# Patient Record
Sex: Male | Born: 1985 | Race: Black or African American | Hispanic: No | Marital: Single | Smoking: Current every day smoker
Health system: Southern US, Community
[De-identification: ages and names within clinical notes are randomized; demographics above are authoritative.]

---

## 2004-04-20 ENCOUNTER — Emergency Department (HOSPITAL_COMMUNITY): Admission: EM | Admit: 2004-04-20 | Discharge: 2004-04-20 | Payer: Self-pay | Admitting: *Deleted

## 2004-04-25 ENCOUNTER — Emergency Department (HOSPITAL_COMMUNITY): Admission: EM | Admit: 2004-04-25 | Discharge: 2004-04-25 | Payer: Self-pay | Admitting: Family Medicine

## 2007-02-04 ENCOUNTER — Emergency Department (HOSPITAL_COMMUNITY): Admission: EM | Admit: 2007-02-04 | Discharge: 2007-02-04 | Payer: Self-pay | Admitting: Emergency Medicine

## 2007-06-11 ENCOUNTER — Emergency Department (HOSPITAL_COMMUNITY): Admission: EM | Admit: 2007-06-11 | Discharge: 2007-06-11 | Payer: Self-pay | Admitting: Emergency Medicine

## 2008-06-30 ENCOUNTER — Emergency Department (HOSPITAL_COMMUNITY): Admission: EM | Admit: 2008-06-30 | Discharge: 2008-06-30 | Payer: Self-pay | Admitting: Family Medicine

## 2008-09-14 ENCOUNTER — Emergency Department (HOSPITAL_COMMUNITY): Admission: EM | Admit: 2008-09-14 | Discharge: 2008-09-14 | Payer: Self-pay | Admitting: Emergency Medicine

## 2009-01-26 ENCOUNTER — Emergency Department (HOSPITAL_COMMUNITY): Admission: EM | Admit: 2009-01-26 | Discharge: 2009-01-26 | Payer: Self-pay | Admitting: Family Medicine

## 2009-07-12 ENCOUNTER — Emergency Department (HOSPITAL_COMMUNITY): Admission: EM | Admit: 2009-07-12 | Discharge: 2009-07-12 | Payer: Self-pay | Admitting: Family Medicine

## 2009-10-20 ENCOUNTER — Emergency Department (HOSPITAL_COMMUNITY): Admission: EM | Admit: 2009-10-20 | Discharge: 2009-10-20 | Payer: Self-pay | Admitting: Emergency Medicine

## 2010-02-22 ENCOUNTER — Emergency Department (HOSPITAL_COMMUNITY)
Admission: EM | Admit: 2010-02-22 | Discharge: 2010-02-23 | Payer: Self-pay | Source: Home / Self Care | Admitting: Emergency Medicine

## 2010-05-26 LAB — GC/CHLAMYDIA PROBE AMP, GENITAL
Chlamydia, DNA Probe: POSITIVE — AB
GC Probe Amp, Genital: NEGATIVE

## 2010-05-30 LAB — GC/CHLAMYDIA PROBE AMP, GENITAL: GC Probe Amp, Genital: POSITIVE — AB

## 2010-06-14 ENCOUNTER — Inpatient Hospital Stay (INDEPENDENT_AMBULATORY_CARE_PROVIDER_SITE_OTHER)
Admission: RE | Admit: 2010-06-14 | Discharge: 2010-06-14 | Disposition: A | Discharge status: Home / Self Care | Payer: Self-pay | Source: Ambulatory Visit | Attending: Emergency Medicine | Admitting: Emergency Medicine

## 2010-06-14 DIAGNOSIS — N342 Other urethritis: Secondary | ICD-10-CM

## 2010-06-15 LAB — GC/CHLAMYDIA PROBE AMP, GENITAL: Chlamydia, DNA Probe: NEGATIVE

## 2010-07-19 ENCOUNTER — Emergency Department (HOSPITAL_COMMUNITY)
Admission: EM | Admit: 2010-07-19 | Discharge: 2010-07-19 | Disposition: A | Discharge status: Home / Self Care | Payer: Self-pay | Attending: Emergency Medicine | Admitting: Emergency Medicine

## 2010-07-19 DIAGNOSIS — H0019 Chalazion unspecified eye, unspecified eyelid: Secondary | ICD-10-CM | POA: Insufficient documentation

## 2010-07-19 DIAGNOSIS — H5789 Other specified disorders of eye and adnexa: Secondary | ICD-10-CM | POA: Insufficient documentation

## 2010-12-05 LAB — GC/CHLAMYDIA PROBE AMP, GENITAL
Chlamydia, DNA Probe: NEGATIVE
GC Probe Amp, Genital: NEGATIVE

## 2010-12-19 LAB — GC/CHLAMYDIA PROBE AMP, GENITAL
Chlamydia, DNA Probe: NEGATIVE
GC Probe Amp, Genital: POSITIVE — AB

## 2010-12-19 LAB — RPR: RPR Ser Ql: NONREACTIVE

## 2012-11-15 ENCOUNTER — Emergency Department (HOSPITAL_COMMUNITY)
Admission: EM | Admit: 2012-11-15 | Discharge: 2012-11-15 | Disposition: A | Discharge status: Home / Self Care | Payer: Self-pay | Attending: Emergency Medicine | Admitting: Emergency Medicine

## 2012-11-15 ENCOUNTER — Encounter (HOSPITAL_COMMUNITY): Payer: Self-pay | Admitting: Emergency Medicine

## 2012-11-15 DIAGNOSIS — R21 Rash and other nonspecific skin eruption: Secondary | ICD-10-CM | POA: Insufficient documentation

## 2012-11-15 DIAGNOSIS — F411 Generalized anxiety disorder: Secondary | ICD-10-CM | POA: Insufficient documentation

## 2012-11-15 DIAGNOSIS — T7840XA Allergy, unspecified, initial encounter: Secondary | ICD-10-CM

## 2012-11-15 DIAGNOSIS — F17200 Nicotine dependence, unspecified, uncomplicated: Secondary | ICD-10-CM | POA: Insufficient documentation

## 2012-11-15 DIAGNOSIS — T4995XA Adverse effect of unspecified topical agent, initial encounter: Secondary | ICD-10-CM | POA: Insufficient documentation

## 2012-11-15 DIAGNOSIS — Z880 Allergy status to penicillin: Secondary | ICD-10-CM | POA: Insufficient documentation

## 2012-11-15 MED ORDER — PREDNISONE 20 MG PO TABS: ORAL_TABLET | ORAL | Status: DC

## 2012-11-15 MED ORDER — HYDROXYZINE HCL 25 MG PO TABS
25.0000 mg | ORAL_TABLET | Freq: Once | ORAL | Status: AC
  Administered 2012-11-15: 25 mg via ORAL
  Filled 2012-11-15: qty 1

## 2012-11-15 MED ORDER — FAMOTIDINE 20 MG PO TABS: 20.0000 mg | ORAL_TABLET | Freq: Two times a day (BID) | ORAL | Status: DC

## 2012-11-15 MED ORDER — PREDNISONE 20 MG PO TABS
60.0000 mg | ORAL_TABLET | Freq: Once | ORAL | Status: AC
  Administered 2012-11-15: 60 mg via ORAL
  Filled 2012-11-15: qty 3

## 2012-11-15 MED ORDER — DIPHENHYDRAMINE HCL 25 MG PO TABS: 25.0000 mg | ORAL_TABLET | Freq: Four times a day (QID) | ORAL | Status: DC

## 2012-11-15 NOTE — ED Provider Notes (Signed)
Medical screening examination/treatment/procedure(s) were performed by non-physician practitioner and as supervising physician I was immediately available for consultation/collaboration.    Gilda Crease, MD 11/15/12 1410

## 2012-11-15 NOTE — ED Notes (Signed)
Pt c/o rash on arm and legs x3 days.  Reports rash appeared after a he got a tattoo the same day.  Reports hearing a recall on ink.

## 2012-11-15 NOTE — ED Provider Notes (Signed)
CSN: [id number]     Arrival date & time 11/15/12  1301 History   First MD Initiated Contact with Patient 11/15/12 1321     Chief Complaint  Patient presents with  . Rash   (Consider location/radiation/quality/duration/timing/severity/associated sxs/prior Treatment) HPI Comments: Patient presents with full body pruritic rash x3 days. States that the rash began after getting a tattoo. States that he is very worried because he read that there is a recall on tattoo ink and he is worried that he has a bacterial infection from the ink. Patient denies fever, chills, body aches, nausea, vomiting, abdominal pain.  Denies any pain with a rash, denies any discharge from the rash. States this was a new tattoo parlor where he received the tattoo. He does not know what kind of ink they used.  Denies difficulty swallowing or breathing.   Patient is a 27 y.o. male presenting with rash. The history is provided by the patient.  Rash Associated symptoms: no abdominal pain, no diarrhea, no fever, no nausea, no shortness of breath, no sore throat and not vomiting     History reviewed. No pertinent past medical history. History reviewed. No pertinent past surgical history. History reviewed. No pertinent family history. History  Substance Use Topics  . Smoking status: Current Every Day Smoker  . Smokeless tobacco: Not on file  . Alcohol Use: Yes    Review of Systems  Constitutional: Negative for fever.  HENT: Negative for sore throat and trouble swallowing.   Respiratory: Negative for cough and shortness of breath.   Cardiovascular: Negative for chest pain.  Gastrointestinal: Negative for nausea, vomiting, abdominal pain and diarrhea.  Skin: Positive for rash. Negative for wound.    Allergies  Penicillins  Home Medications   Current Outpatient Rx  Name  Route  Sig  Dispense  Refill  . diphenhydrAMINE (BENADRYL) 25 mg capsule   Oral   Take 25 mg by mouth every 6 (six) hours as needed for itching.         . diphenhydrAMINE (BENADRYL) 25 MG tablet   Oral   Take 1 tablet (25 mg total) by mouth every 6 (six) hours. X 3 days, then as needed   20 tablet   0   . famotidine (PEPCID) 20 MG tablet   Oral   Take 1 tablet (20 mg total) by mouth 2 (two) times daily. X 3 days then PRN   15 tablet   0   . predniSONE (DELTASONE) 20 MG tablet      3 tabs po daily x 3 days, then 2 tabs x 3 days, then 1.5 tabs x 3 days, then 1 tab x 3 days, then 0.5 tabs x 3 days   27 tablet   0    BP 123/78  Pulse 94  Temp(Src) 98.6 F (37 C) (Oral)  Resp 14  SpO2 100% Physical Exam  Nursing note and vitals reviewed. Constitutional: He appears well-developed and well-nourished. No distress.  HENT:  Head: Normocephalic and atraumatic.  Neck: Neck supple.  Pulmonary/Chest: Effort normal.  Neurological: He is alert.  Skin: Rash noted. He is not diaphoretic.  Erythematous papular rash throughout the entire body. Tattoos over her upper chest and upper back are without erythema, edema, warmth, discharge. They are nontender.  Psychiatric: His mood appears anxious.    ED Course  Procedures (including critical care time) Labs Review Labs Reviewed - No data to display Imaging Review No results found.  MDM   1. Allergic reaction, initial encounter  Patient is afebrile, nontoxic, normal vital sign. He has a full body erythematous rash that is pruritic and nontender. Appears to be allergic reaction. Patient does not have any symptoms with this mouth or throat and no difficulty breathing or swallowing. The skin around his tattoo site is not cellulitic, no evidence of abscess. I doubt bacterial infection. The ink recalled was mostly used in tattooing and is unlikely to have been the ink used by the tattoo parlor. However, I have asked patient to followup with the tattoo parlor for his own anxiety. Reassured the patient that this is not a bacterial infection and he does not have sepsis.  Discussed   findings, treatment, and follow up  with patient.  Pt given return precautions.  Pt verbalizes understanding and agrees with plan.        Trixie Dredge, PA-C 11/15/12 1409

## 2013-05-12 ENCOUNTER — Encounter (HOSPITAL_COMMUNITY): Payer: Self-pay | Admitting: Emergency Medicine

## 2013-05-12 ENCOUNTER — Emergency Department (HOSPITAL_COMMUNITY): Payer: Self-pay

## 2013-05-12 ENCOUNTER — Emergency Department (HOSPITAL_COMMUNITY)
Admission: EM | Admit: 2013-05-12 | Discharge: 2013-05-12 | Disposition: A | Discharge status: Home / Self Care | Payer: Self-pay | Attending: Emergency Medicine | Admitting: Emergency Medicine

## 2013-05-12 DIAGNOSIS — Y929 Unspecified place or not applicable: Secondary | ICD-10-CM | POA: Insufficient documentation

## 2013-05-12 DIAGNOSIS — R109 Unspecified abdominal pain: Secondary | ICD-10-CM | POA: Insufficient documentation

## 2013-05-12 DIAGNOSIS — T148XXA Other injury of unspecified body region, initial encounter: Secondary | ICD-10-CM

## 2013-05-12 DIAGNOSIS — M545 Low back pain, unspecified: Secondary | ICD-10-CM

## 2013-05-12 DIAGNOSIS — F17200 Nicotine dependence, unspecified, uncomplicated: Secondary | ICD-10-CM | POA: Insufficient documentation

## 2013-05-12 DIAGNOSIS — S335XXA Sprain of ligaments of lumbar spine, initial encounter: Secondary | ICD-10-CM | POA: Insufficient documentation

## 2013-05-12 DIAGNOSIS — X500XXA Overexertion from strenuous movement or load, initial encounter: Secondary | ICD-10-CM | POA: Insufficient documentation

## 2013-05-12 DIAGNOSIS — Y9389 Activity, other specified: Secondary | ICD-10-CM | POA: Insufficient documentation

## 2013-05-12 DIAGNOSIS — Z880 Allergy status to penicillin: Secondary | ICD-10-CM | POA: Insufficient documentation

## 2013-05-12 LAB — COMPREHENSIVE METABOLIC PANEL
ALBUMIN: 4 g/dL (ref 3.5–5.2)
ALK PHOS: 41 U/L (ref 39–117)
ALT: 15 U/L (ref 0–53)
AST: 19 U/L (ref 0–37)
BUN: 10 mg/dL (ref 6–23)
CALCIUM: 9.4 mg/dL (ref 8.4–10.5)
CO2: 27 meq/L (ref 19–32)
CREATININE: 0.97 mg/dL (ref 0.50–1.35)
Chloride: 99 mEq/L (ref 96–112)
GFR calc Af Amer: >90 mL/min (ref >90)
GFR calc non Af Amer: >90 mL/min (ref >90)
GLUCOSE: 85 mg/dL (ref 70–99)
Potassium: 3.4 mEq/L — ABNORMAL LOW (ref 3.7–5.3)
SODIUM: 138 meq/L (ref 137–147)
Total Bilirubin: 0.4 mg/dL (ref 0.3–1.2)
Total Protein: 7.7 g/dL (ref 6.0–8.3)

## 2013-05-12 LAB — CBC WITH DIFFERENTIAL/PLATELET
BASOS ABS: 0.1 10*3/uL (ref 0.0–0.1)
Basophils Relative: 1 % (ref 0–1)
Eosinophils Absolute: 0.2 10*3/uL (ref 0.0–0.7)
Eosinophils Relative: 3 % (ref 0–5)
HEMATOCRIT: 43.8 % (ref 39.0–52.0)
Hemoglobin: 15.3 g/dL (ref 13.0–17.0)
LYMPHS PCT: 35 % (ref 12–46)
Lymphs Abs: 2.4 10*3/uL (ref 0.7–4.0)
MCH: 32.4 pg (ref 26.0–34.0)
MCHC: 34.9 g/dL (ref 30.0–36.0)
MCV: 92.8 fL (ref 78.0–100.0)
Monocytes Absolute: 0.8 10*3/uL (ref 0.1–1.0)
Monocytes Relative: 12 % (ref 3–12)
NEUTROS ABS: 3.3 10*3/uL (ref 1.7–7.7)
NEUTROS PCT: 49 % (ref 43–77)
PLATELETS: 291 10*3/uL (ref 150–400)
RBC: 4.72 MIL/uL (ref 4.22–5.81)
RDW: 12.2 % (ref 11.5–15.5)
WBC: 6.8 10*3/uL (ref 4.0–10.5)

## 2013-05-12 LAB — URINALYSIS, ROUTINE W REFLEX MICROSCOPIC
Bilirubin Urine: NEGATIVE
GLUCOSE, UA: NEGATIVE mg/dL
Hgb urine dipstick: NEGATIVE
Ketones, ur: NEGATIVE mg/dL
Leukocytes, UA: NEGATIVE
NITRITE: NEGATIVE
PH: 6 (ref 5.0–8.0)
Protein, ur: NEGATIVE mg/dL
Specific Gravity, Urine: 1.023 (ref 1.005–1.030)
Urobilinogen, UA: 0.2 mg/dL (ref 0.0–1.0)

## 2013-05-12 LAB — LIPASE, BLOOD: Lipase: 32 U/L (ref 11–59)

## 2013-05-12 MED ORDER — HYDROCODONE-ACETAMINOPHEN 5-325 MG PO TABS: ORAL_TABLET | ORAL | Status: DC

## 2013-05-12 MED ORDER — KETOROLAC TROMETHAMINE 60 MG/2ML IM SOLN
60.0000 mg | Freq: Once | INTRAMUSCULAR | Status: AC
  Administered 2013-05-12: 60 mg via INTRAMUSCULAR
  Filled 2013-05-12: qty 2

## 2013-05-12 NOTE — ED Notes (Signed)
Pt reports that he has been having R sided flank pain with a strong odor to his urine, reports he has been taking Motrin at night so that he is able to sleep and that he took x1 Amoxicillin from his cousin. Pt states that he lifted a heavy object last Tuesday, but the pain has been gradually increasing since that time. Pt a&o x4, ambulatory to triage.

## 2013-05-12 NOTE — Progress Notes (Signed)
   CARE MANAGEMENT ED NOTE 05/12/2013  Patient:  Alben SpittleHERBIN,Vong L   Account Number:  [id number]  Date Initiated:  05/12/2013  Documentation initiated by:  Radford PaxFERRERO,Lafaye Mcelmurry  Subjective/Objective Assessment:   Patient prsents to Ed with right sided flank pain     Subjective/Objective Assessment Detail:     Action/Plan:   Action/Plan Detail:   Anticipated DC Date:  05/12/2013     Status Recommendation to Physician:   Result of Recommendation:    Other ED Services  Consult Working Plan    DC Planning Services  Other  PCP issues    Choice offered to / List presented to:            Status of service:  Completed, signed off  ED Comments:   ED Comments Detail:  Patient confirms he does not have a pcp.  EDCM providedpatient with a list of pcps who accept patients without insurance, list of discounted pharmacies and website needymeds.org for [employer] assistance, financial resources in the community such as  local churches and [employer], information regarding Affordable care act and medicaid for insurance, phone number to inquire about the [place] card, and dental assistance for uninsured patients.  Patient reports he does not need any help with medications at this time.  Patient thankful for resources. No further EDCM needs at this time.

## 2013-05-12 NOTE — ED Notes (Signed)
Patient transported to X-ray 

## 2013-05-12 NOTE — ED Provider Notes (Signed)
CSN: [id number]     Arrival date & time 05/12/13  1919 History   First MD Initiated Contact with Patient 05/12/13 1956     Chief Complaint  Patient presents with  . Flank Pain     (Consider location/radiation/quality/duration/timing/severity/associated sxs/prior Treatment) HPI Pt is a 28yo male with no significant PMH c/o right sided flank pain and right lower back pain that has been intermittent x 1 week, reports lifting a heavy trash can last Tuesday, 2/24, pain has been gradually worsening since that time. Does report strong odor to his urine but denies fever, n/v/d. Denies abdominal pain. Denies difficult urinating, dysuria, or hematuria. Denies hx of renal stones. Has been taking Motrin PM to help him sleep that provides moderate relief and one dose of Amoxicillin from his cousin but states no improvement with that. Denies hx of abdominal surgeries or hernias.   History reviewed. No pertinent past medical history. History reviewed. No pertinent past surgical history. History reviewed. No pertinent family history. History  Substance Use Topics  . Smoking status: Current Every Day Smoker -- 0.50 packs/day    Types: Cigarettes  . Smokeless tobacco: Never Used  . Alcohol Use: Yes    Review of Systems  Constitutional: Negative for fever and chills.  Gastrointestinal: Negative for nausea, vomiting, abdominal pain, diarrhea and constipation.  Genitourinary: Positive for flank pain ( right). Negative for dysuria.  All other systems reviewed and are negative.      Allergies  Penicillins  Home Medications   Current Outpatient Rx  Name  Route  Sig  Dispense  Refill  . ibuprofen (ADVIL,MOTRIN) 200 MG tablet   Oral   Take 200 mg by mouth every 6 (six) hours as needed (pain.).         Marland Kitchen HYDROcodone-acetaminophen (NORCO/VICODIN) 5-325 MG per tablet      Take 1-2 pills every 4-6 hours as needed for pain.   10 tablet   0    BP 139/84  Pulse 77  Temp(Src) 98.1 F (36.7 C)  (Oral)  Resp 16  SpO2 100% Physical Exam  Nursing note and vitals reviewed. Constitutional: He appears well-developed and well-nourished.  Pt lying comfortably in exam bed, NAD.   HENT:  Head: Normocephalic and atraumatic.  Eyes: Conjunctivae are normal. No scleral icterus.  Neck: Normal range of motion. Neck supple.  Cardiovascular: Normal rate, regular rhythm and normal heart sounds.   Pulmonary/Chest: Effort normal and breath sounds normal. No respiratory distress. He has no wheezes. He has no rales. He exhibits no tenderness.  Abdominal: Soft. Bowel sounds are normal. He exhibits no distension and no mass. There is no tenderness. There is no rebound and no guarding.  Soft, non-distended, non-tender. No CVAT  Musculoskeletal: Normal range of motion. He exhibits tenderness. He exhibits no edema.  Tenderness along lower lumbar spine and right lumbar musculature. No step offs or crepitus. Normal gait. FROM all 4 extremities w/o difficulty.   Neurological: He is alert.  Skin: Skin is warm and dry.    ED Course  Procedures (including critical care time) Labs Review Labs Reviewed  COMPREHENSIVE METABOLIC PANEL - Abnormal; Notable for the following:    Potassium 3.4 (*)    All other components within normal limits  URINALYSIS, ROUTINE W REFLEX MICROSCOPIC  CBC WITH DIFFERENTIAL  LIPASE, BLOOD   Imaging Review Dg Lumbar Spine Complete  05/12/2013   CLINICAL DATA:  Back pain.  EXAM: LUMBAR SPINE - COMPLETE 4+ VIEW  COMPARISON:  None.  FINDINGS: Normal  alignment of the lumbar vertebral bodies. Disc spaces and vertebral bodies are maintained. The facets are normally aligned. No pars defects. The visualized bony pelvis is intact.  IMPRESSION: Normal alignment and no acute bony findings.   Electronically Signed   By: Loralie ChampagneMark  Gallerani M.D.   On: 05/12/2013 20:49     EKG Interpretation None      MDM   Final diagnoses:  Right low back pain  Right flank pain  Muscle strain    Pt c/o  right sided lower back pain and flank pain x1 week, worsening after lifting heavy trash can 1 week ago.  Pt reports strong odor to urine but denies dysuria or hematuria. No hx of renal stones. Vitals: WNL. Pt appears well. Pt is tender along lumbar spine and musculature. Low concern for renal stone.  Discussed getting plain films lumbar spine due to pt's tenderness and reported possible MOI with lifting trash can.  Pt smiled at suggestion of imaging, when asked if he wanted any further workup, pt agreed.    Plain films: normal alignment, no acute bony findings.  Will tx for muscle strain.  CT abd not ordered at this time as pt will be tx symptomatically for pain. Not concerned for obstruction, surgical abdomen or emergent process taking place at this. Advised to f/u with PCP in 2-3 days if symptoms not improving.  Return precautions provided. Pt verbalized understanding and agreement with tx plan.    Junius FinnerErin O'Malley, PA-C 05/13/13 0102

## 2013-05-20 NOTE — ED Provider Notes (Signed)
Medical screening examination/treatment/procedure(s) were performed by non-physician practitioner and as supervising physician I was immediately available for consultation/collaboration.   EKG Interpretation None        Izabellah Dadisman, MD 05/20/13 0725 

## 2014-10-13 ENCOUNTER — Emergency Department (HOSPITAL_COMMUNITY)
Admission: EM | Admit: 2014-10-13 | Discharge: 2014-10-13 | Disposition: A | Discharge status: Home / Self Care | Payer: Self-pay | Attending: Emergency Medicine | Admitting: Emergency Medicine

## 2014-10-13 ENCOUNTER — Encounter (HOSPITAL_COMMUNITY): Payer: Self-pay | Admitting: Emergency Medicine

## 2014-10-13 DIAGNOSIS — Z202 Contact with and (suspected) exposure to infections with a predominantly sexual mode of transmission: Secondary | ICD-10-CM | POA: Insufficient documentation

## 2014-10-13 DIAGNOSIS — Z720 Tobacco use: Secondary | ICD-10-CM | POA: Insufficient documentation

## 2014-10-13 DIAGNOSIS — Z880 Allergy status to penicillin: Secondary | ICD-10-CM | POA: Insufficient documentation

## 2014-10-13 MED ORDER — AZITHROMYCIN 250 MG PO TABS
1000.0000 mg | ORAL_TABLET | Freq: Once | ORAL | Status: AC
  Administered 2014-10-13: 1000 mg via ORAL
  Filled 2014-10-13: qty 4

## 2014-10-13 MED ORDER — LIDOCAINE HCL 1 % IJ SOLN
INTRAMUSCULAR | Status: AC
  Administered 2014-10-13: 20 mL
  Filled 2014-10-13: qty 20

## 2014-10-13 MED ORDER — CEFTRIAXONE SODIUM 250 MG IJ SOLR
250.0000 mg | Freq: Once | INTRAMUSCULAR | Status: AC
  Administered 2014-10-13: 250 mg via INTRAMUSCULAR
  Filled 2014-10-13: qty 250

## 2014-10-13 NOTE — ED Provider Notes (Signed)
CSN: [id number]     Arrival date & time 10/13/14  1459 History  This chart was scribed for non-physician practitioner Santiago Glad, PA-C working with Rolland Porter, MD by Littie Deeds, ED Scribe. This patient was seen in room WTR5/WTR5 and the patient's care was started at 3:33 PM.       Chief Complaint  Patient presents with  . Exposure to STD   The history is provided by the patient. No language interpreter was used.   HPI Comments: Andrew Preston is a 29 y.o. male who presents to the Emergency Department for a STD check. Patient was called by a sexual partner this morning and was told to be tested for STD's, although he was not told which STD. He did have unprotected sexual intercourse with this partner. Patient denies fever, penile discharge, dysuria, testicular pain, scrotal swelling, penile pain, and penile swelling. He does have history of gonorrhea.   History reviewed. No pertinent past medical history. History reviewed. No pertinent past surgical history. History reviewed. No pertinent family history. History  Substance Use Topics  . Smoking status: Current Every Day Smoker -- 0.50 packs/day    Types: Cigarettes  . Smokeless tobacco: Never Used  . Alcohol Use: Yes    Review of Systems  Constitutional: Negative for fever.  Genitourinary: Negative for dysuria, discharge, penile swelling, scrotal swelling, penile pain and testicular pain.      Allergies  Penicillins  Home Medications   Prior to Admission medications   Medication Sig Start Date End Date Taking? Authorizing Provider  HYDROcodone-acetaminophen (NORCO/VICODIN) 5-325 MG per tablet Take 1-2 pills every 4-6 hours as needed for pain. 05/12/13   Junius Finner, PA-C  ibuprofen (ADVIL,MOTRIN) 200 MG tablet Take 200 mg by mouth every 6 (six) hours as needed (pain.).    Historical Provider, MD   BP 123/71 mmHg  Pulse 74  Temp(Src) 98.5 F (36.9 C) (Oral)  Resp 17  SpO2 100% Physical Exam  Constitutional: He is  oriented to person, place, and time. He appears well-developed and well-nourished. No distress.  HENT:  Head: Normocephalic and atraumatic.  Mouth/Throat: Oropharynx is clear and moist. No oropharyngeal exudate.  Eyes: Pupils are equal, round, and reactive to light.  Neck: Neck supple.  Cardiovascular: Normal rate, regular rhythm and normal heart sounds.   Pulmonary/Chest: Effort normal and breath sounds normal.  Genitourinary:  Patient declined  Musculoskeletal: He exhibits no edema.  Neurological: He is alert and oriented to person, place, and time. No cranial nerve deficit.  Skin: Skin is warm and dry. No rash noted.  Psychiatric: He has a normal mood and affect. His behavior is normal.  Nursing note and vitals reviewed.   ED Course  Procedures  DIAGNOSTIC STUDIES: Oxygen Saturation is 100% on room air, normal by my interpretation.    COORDINATION OF CARE: 3:37 PM-Discussed treatment plan which includes medications and labs with patient/guardian at bedside and patient/guardian agreed to plan.   Labs Review Labs Reviewed - No data to display  Imaging Review No results found.   EKG Interpretation None      MDM   Final diagnoses:  None   Patient presents today requesting STD check.  He states that a partner called him this morning and told him that he needs to get tested due to the fact that she was diagnosed with a STD.  He is unsure of what STD she had.  Patient is asymptomatic at this time.  GC/Chlamydia and HIV pending.  Patient given  prophylactic treatment with Azithromycin and Rocephin.  Stable for discharge.  Return precautions given.     Santiago Glad, PA-C 10/13/14 1641  Rolland Porter, MD 10/19/14 [phone]

## 2014-10-13 NOTE — ED Notes (Signed)
Reports a previous partner called him this morning and told him she had an STD-unsure which kind. Denies fever/penile discharge/etc. No other c/c.

## 2014-10-14 LAB — GC/CHLAMYDIA PROBE AMP (~~LOC~~) NOT AT ARMC
CHLAMYDIA, DNA PROBE: POSITIVE — AB
NEISSERIA GONORRHEA: NEGATIVE

## 2014-10-14 LAB — HIV ANTIBODY (ROUTINE TESTING W REFLEX): HIV SCREEN 4TH GENERATION: NONREACTIVE

## 2014-10-15 ENCOUNTER — Telehealth (HOSPITAL_COMMUNITY): Payer: Self-pay | Admitting: Emergency Medicine

## 2014-10-15 NOTE — Telephone Encounter (Signed)
Positive Chlamydia culture Treated with Zithromax and Rocephin per protocol MD [facility] faxed  Patient contacted 10/15/14 @ 1200 pt returned call, ID verified x three. Notified of positive Chlamydia and that treated with Zithromax and Rocephin. STD instructions given, patient verbalized understanding.

## 2014-10-21 IMAGING — CR DG LUMBAR SPINE COMPLETE 4+V
5 series · 5 of 5 positions shown · non-contrast
Comparison: None.

CLINICAL DATA: Back pain.

EXAM:
LUMBAR SPINE - COMPLETE 4+ VIEW

[t lumbar spine ap]
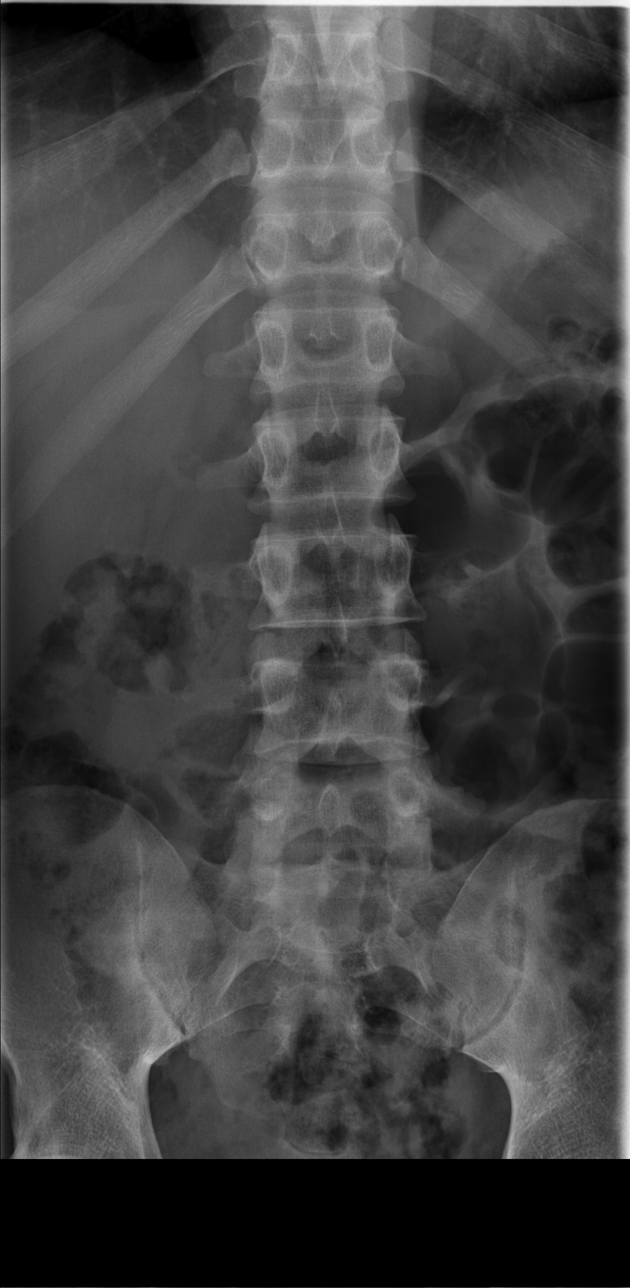

[t lumbar spine obl (1 of 2)]
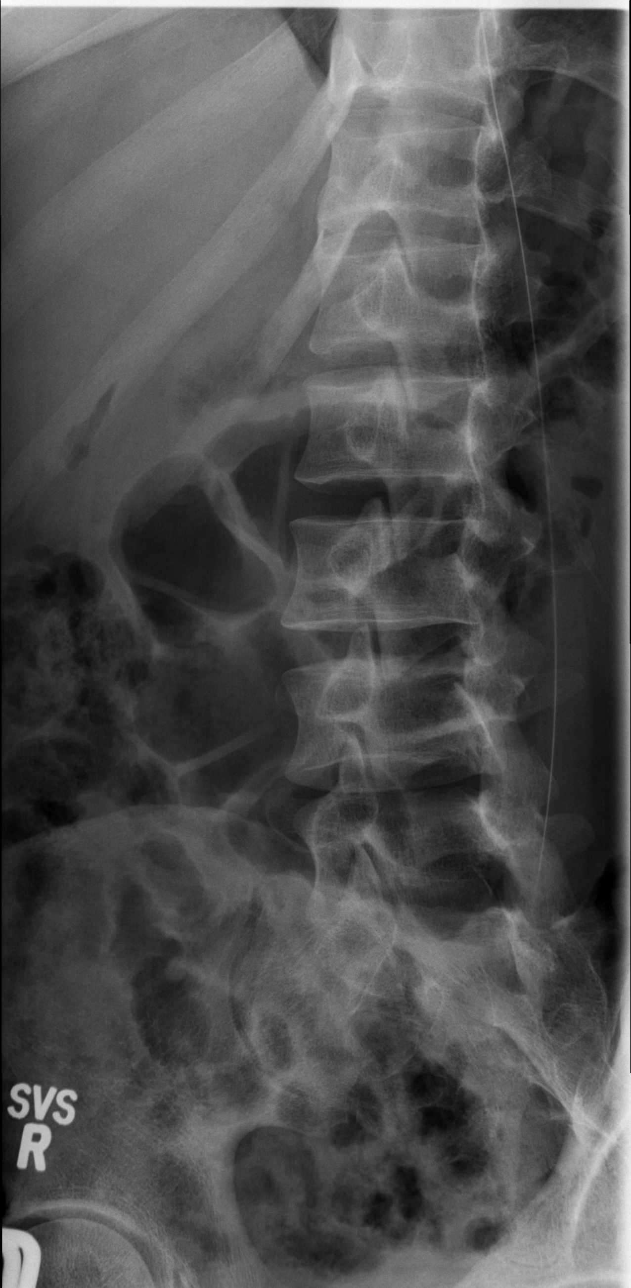

[t lumbar spine obl (2 of 2)]
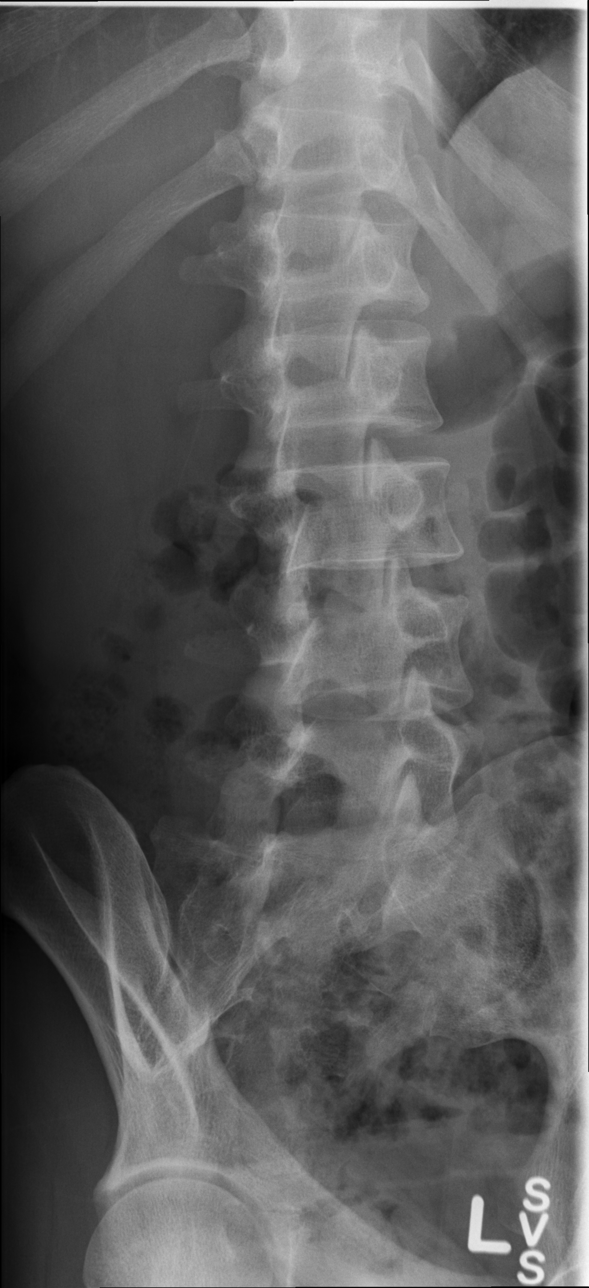

[t lumbar spine lat]
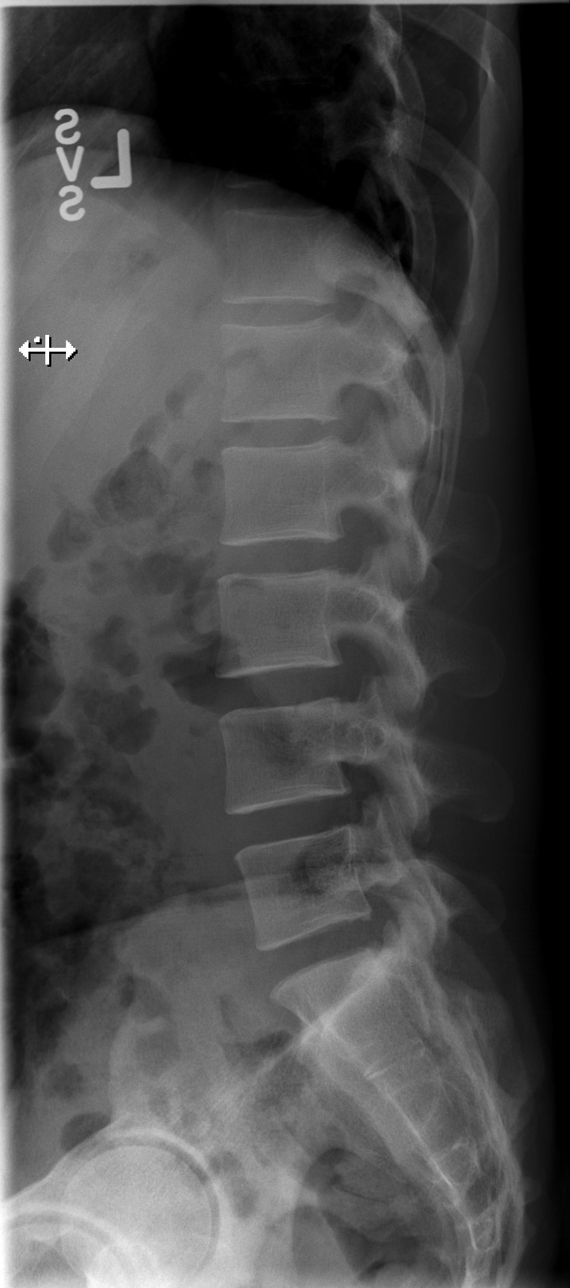

[t lumbar l-5 s-1 spot]
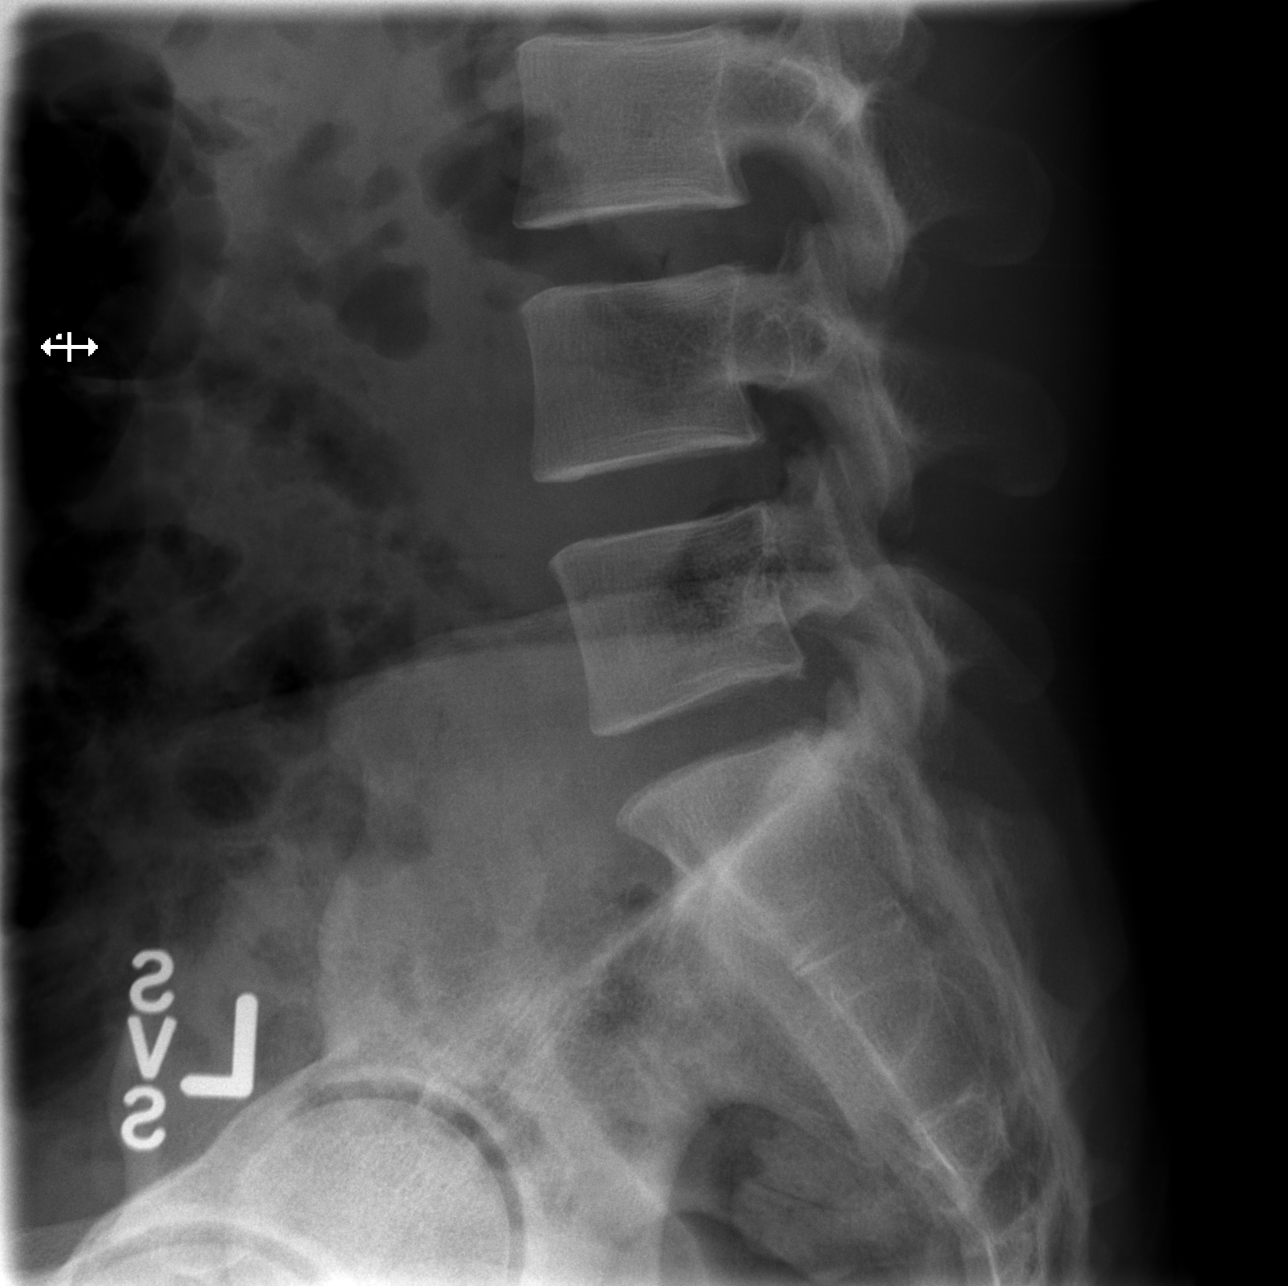

[5 of 5 positions shown; findings below may reference images not displayed]

FINDINGS: Normal alignment of the lumbar vertebral bodies. Disc spaces and
vertebral bodies are maintained. The facets are normally aligned. No
pars defects. The visualized bony pelvis is intact.
IMPRESSION: Normal alignment and no acute bony findings.

## 2014-11-10 ENCOUNTER — Emergency Department (HOSPITAL_COMMUNITY)
Admission: EM | Admit: 2014-11-10 | Discharge: 2014-11-10 | Disposition: A | Discharge status: Home / Self Care | Payer: Self-pay | Attending: Emergency Medicine | Admitting: Emergency Medicine

## 2014-11-10 ENCOUNTER — Encounter (HOSPITAL_COMMUNITY): Payer: Self-pay | Admitting: Emergency Medicine

## 2014-11-10 DIAGNOSIS — Z720 Tobacco use: Secondary | ICD-10-CM | POA: Insufficient documentation

## 2014-11-10 DIAGNOSIS — Z880 Allergy status to penicillin: Secondary | ICD-10-CM | POA: Insufficient documentation

## 2014-11-10 DIAGNOSIS — Z202 Contact with and (suspected) exposure to infections with a predominantly sexual mode of transmission: Secondary | ICD-10-CM | POA: Insufficient documentation

## 2014-11-10 MED ORDER — AZITHROMYCIN 250 MG PO TABS
1000.0000 mg | ORAL_TABLET | Freq: Once | ORAL | Status: AC
  Administered 2014-11-10: 1000 mg via ORAL
  Filled 2014-11-10: qty 4

## 2014-11-10 MED ORDER — LIDOCAINE HCL (PF) 1 % IJ SOLN
0.9000 mL | Freq: Once | INTRAMUSCULAR | Status: AC
  Administered 2014-11-10: 0.9 mL
  Filled 2014-11-10: qty 5

## 2014-11-10 MED ORDER — CEFTRIAXONE SODIUM 250 MG IJ SOLR
250.0000 mg | Freq: Once | INTRAMUSCULAR | Status: AC
  Administered 2014-11-10: 250 mg via INTRAMUSCULAR
  Filled 2014-11-10: qty 250

## 2014-11-10 NOTE — ED Notes (Signed)
Per patient, states he was diagnosed with STD, states he was treated but still having symptoms

## 2014-11-10 NOTE — ED Provider Notes (Signed)
CSN: [id number]     Arrival date & time 11/10/14  1509 History  This chart was scribed for non-physician practitioner Joycie Peek, PA-C working with Mirian Mo, MD by Murriel Hopper, ED Scribe. This patient was seen in room WTR5/WTR5 and the patient's care was started at 4:15 PM.    Chief Complaint  Patient presents with  . Penile Discharge      The history is provided by the patient. No language interpreter was used.     HPI Comments: Andrew Preston is a 29 y.o. male who presents to the Emergency Department complaining of constant recurrent penile discharge that has been present for a week. Pt states he was seen on 10/13/14 and treated for chlamydia, and states that his symptoms went away until around a week and a half ago when he had intercourse for the first time since being treated. Pt states he began having discharge again a week ago and notes that he has had discharge constantly since then. Pt denies abdominal pain, fever, vomiting, dysuria, hematuria. Pt refuses to receive GU exam.    History reviewed. No pertinent past medical history. History reviewed. No pertinent past surgical history. No family history on file. Social History  Substance Use Topics  . Smoking status: Current Every Day Smoker -- 0.50 packs/day    Types: Cigarettes  . Smokeless tobacco: Never Used  . Alcohol Use: Yes    Review of Systems  Constitutional: Negative for fever.  Gastrointestinal: Negative for vomiting and abdominal pain.  Genitourinary: Positive for discharge. Negative for dysuria and hematuria.  Musculoskeletal: Negative for arthralgias.  Skin: Negative for rash.      Allergies  Penicillins  Home Medications   Prior to Admission medications   Medication Sig Start Date End Date Taking? Authorizing Provider  HYDROcodone-acetaminophen (NORCO/VICODIN) 5-325 MG per tablet Take 1-2 pills every 4-6 hours as needed for pain. Patient not taking: Reported on 11/10/2014 05/12/13   Junius Finner, PA-C  ibuprofen (ADVIL,MOTRIN) 200 MG tablet Take 200 mg by mouth every 6 (six) hours as needed (pain.).    Historical Provider, MD   BP 120/77 mmHg  Pulse 61  Temp(Src) 98.2 F (36.8 C) (Oral)  Resp 16  SpO2 100% Physical Exam  Constitutional: He is oriented to person, place, and time. He appears well-developed and well-nourished.  HENT:  Head: Normocephalic and atraumatic.  Cardiovascular: Normal rate.   Pulmonary/Chest: Effort normal.  Abdominal: He exhibits no distension.  Neurological: He is alert and oriented to person, place, and time.  Skin: Skin is warm and dry.  Psychiatric: He has a normal mood and affect.  Nursing note and vitals reviewed.   ED Course  Procedures (including critical care time)  DIAGNOSTIC STUDIES: Oxygen Saturation is 100% on room air, normal by my interpretation.    COORDINATION OF CARE: 4:16 PM Discussed treatment plan with pt at bedside and pt agreed to plan.   Labs Review Labs Reviewed - No data to display  Imaging Review No results found. I have personally reviewed and evaluated these images and lab results as part of my medical decision-making.   EKG Interpretation None     Meds given in ED:  Medications  cefTRIAXone (ROCEPHIN) injection 250 mg (250 mg Intramuscular Given 11/10/14 1629)  azithromycin (ZITHROMAX) tablet 1,000 mg (1,000 mg Oral Given 11/10/14 1629)  lidocaine (PF) (XYLOCAINE) 1 % injection 0.9 mL (0.9 mLs Other Given 11/10/14 1629)    New Prescriptions   No medications on file  Filed Vitals:   11/10/14 1541  BP: 120/77  Pulse: 61  Temp: 98.2 F (36.8 C)  TempSrc: Oral  Resp: 16  SpO2: 100%    MDM  Vitals stable - WNL -afebrile Pt resting comfortably in ED. Patient refuses any other STD testing or GU exam, only wants to be treated for Gonorrhea Chlamydia. Treated with ceftriaxone and azithromycin. Instructed to practice safe sex, follow up with health department.  I discussed all relevant  lab findings and imaging results with pt and they verbalized understanding. Discussed f/u with PCP within 48 hrs and return precautions, pt very amenable to plan.  Final diagnoses:  STD exposure      Joycie Peek, PA-C 11/10/14 2031  Mirian Mo, MD 11/12/14 [phone]

## 2014-11-10 NOTE — Discharge Instructions (Signed)
Safe Sex °Safe sex is about reducing the risk of giving or getting a sexually transmitted disease (STD). STDs are spread through sexual contact involving the genitals, mouth, or rectum. Some STDs can be cured and others cannot. Safe sex can also prevent unintended pregnancies.  °WHAT ARE SOME SAFE SEX PRACTICES? °· Limit your sexual activity to only one partner who is having sex with only you. °· Talk to your partner about his or her past partners, past STDs, and drug use. °· Use a condom every time you have sexual intercourse. This includes vaginal, oral, and anal sexual activity. Both females and males should wear condoms during oral sex. Only use latex or polyurethane condoms and water-based lubricants. Using petroleum-based lubricants or oils to lubricate a condom will weaken the condom and increase the chance that it will break. The condom should be in place from the beginning to the end of sexual activity. Wearing a condom reduces, but does not completely eliminate, your risk of getting or giving an STD. STDs can be spread by contact with infected body fluids and skin. °· Get vaccinated for hepatitis B and HPV. °· Avoid alcohol and recreational drugs, which can affect your judgment. You may forget to use a condom or participate in high-risk sex. °· For females, avoid douching after sexual intercourse. Douching can spread an infection farther into the reproductive tract. °· Check your body for signs of sores, blisters, rashes, or unusual discharge. See your health care provider if you notice any of these signs. °· Avoid sexual contact if you have symptoms of an infection or are being treated for an STD. If you or your partner has herpes, avoid sexual contact when blisters are present. Use condoms at all other times. °· If you are at risk of being infected with HIV, it is recommended that you take a prescription medicine daily to prevent HIV infection. This is called pre-exposure prophylaxis (PrEP). You are  considered at risk if: °¨ You are a man who has sex with other men (MSM). °¨ You are a heterosexual man or woman who is sexually active with more than one partner. °¨ You take drugs by injection. °¨ You are sexually active with a partner who has HIV. °· Talk with your health care provider about whether you are at high risk of being infected with HIV. If you choose to begin PrEP, you should first be tested for HIV. You should then be tested every 3 months for as long as you are taking PrEP. °· See your health care provider for regular screenings, exams, and tests for other STDs. Before having sex with a new partner, each of you should be screened for STDs and should talk about the results with each other. °WHAT ARE THE BENEFITS OF SAFE SEX?  °· There is less chance of getting or giving an STD. °· You can prevent unwanted or unintended pregnancies. °· By discussing safe sex concerns with your partner, you may increase feelings of intimacy, comfort, trust, and honesty between the two of you. °Document Released: 04/05/2004 Document Revised: 07/13/2013 Document Reviewed: 08/20/2011 °ExitCare® Patient Information ©2015 [employer]. This information is not intended to replace advice given to you by your health care provider. Make sure you discuss any questions you have with your health care provider. ° °Sexually Transmitted Disease °A sexually transmitted disease (STD) is a disease or infection that may be passed (transmitted) from person to person, usually during sexual activity. This may happen by way of saliva, semen, blood,   vaginal mucus, or urine. Common STDs include:  °· Gonorrhea.   °· Chlamydia.   °· Syphilis.   °· HIV and AIDS.   °· Genital herpes.   °· Hepatitis B and C.   °· Trichomonas.   °· Human papillomavirus (HPV).   °· Pubic lice.   °· Scabies. °· Mites. °· Bacterial vaginosis. °WHAT ARE CAUSES OF STDs? °An STD may be caused by bacteria, a virus, or parasites. STDs are often transmitted during sexual  activity if one person is infected. However, they may also be transmitted through nonsexual means. STDs may be transmitted after:  °· Sexual intercourse with an infected person.   °· Sharing sex toys with an infected person.   °· Sharing needles with an infected person or using unclean piercing or tattoo needles. °· Having intimate contact with the genitals, mouth, or rectal areas of an infected person.   °· Exposure to infected fluids during birth. °WHAT ARE THE SIGNS AND SYMPTOMS OF STDs? °Different STDs have different symptoms. Some people may not have any symptoms. If symptoms are present, they may include:  °· Painful or bloody urination.   °· Pain in the pelvis, abdomen, vagina, anus, throat, or eyes.   °· A skin rash, itching, or irritation. °· Growths, ulcerations, blisters, or sores in the genital and anal areas. °· Abnormal vaginal discharge with or without bad odor.   °· Penile discharge in men.   °· Fever.   °· Pain or bleeding during sexual intercourse.   °· Swollen glands in the groin area.   °· Yellow skin and eyes (jaundice). This is seen with hepatitis.   °· Swollen testicles. °· Infertility. °· Sores and blisters in the mouth. °HOW ARE STDs DIAGNOSED? °To make a diagnosis, your health care provider may:  °· Take a medical history.   °· Perform a physical exam.   °· Take a sample of any discharge to examine. °· Swab the throat, cervix, opening to the penis, rectum, or vagina for testing. °· Test a sample of your first morning urine.   °· Perform blood tests.   °· Perform a Pap test, if this applies.   °· Perform a colposcopy.   °· Perform a laparoscopy.   °HOW ARE STDs TREATED? ° Treatment depends on the STD. Some STDs may be treated but not cured.  °· Chlamydia, gonorrhea, trichomonas, and syphilis can be cured with antibiotic medicine.   °· Genital herpes, hepatitis, and HIV can be treated, but not cured, with prescribed medicines. The medicines lessen symptoms.   °· Genital warts from HPV can be  treated with medicine or by freezing, burning (electrocautery), or surgery. Warts may come back.   °· HPV cannot be cured with medicine or surgery. However, abnormal areas may be removed from the cervix, vagina, or vulva.   °· If your diagnosis is confirmed, your recent sexual partners need treatment. This is true even if they are symptom-free or have a negative culture or evaluation. They should not have sex until their health care providers say it is okay. °HOW CAN I REDUCE MY RISK OF GETTING AN STD? °Take these steps to reduce your risk of getting an STD: °· Use latex condoms, dental dams, and water-soluble lubricants during sexual activity. Do not use petroleum jelly or oils. °· Avoid having multiple sex partners. °· Do not have sex with someone who has other sex partners. °· Do not have sex with anyone you do not know or who is at high risk for an STD. °· Avoid risky sex practices that can break your skin. °· Do not have sex if you have open sores on your mouth or skin. °· Avoid drinking too   much alcohol or taking illegal drugs. Alcohol and drugs can affect your judgment and put you in a vulnerable position. °· Avoid engaging in oral and anal sex acts. °· Get vaccinated for HPV and hepatitis. If you have not received these vaccines in the past, talk to your health care provider about whether one or both might be right for you.   °· If you are at risk of being infected with HIV, it is recommended that you take a prescription medicine daily to prevent HIV infection. This is called pre-exposure prophylaxis (PrEP). You are considered at risk if: °¨ You are a man who has sex with other men (MSM). °¨ You are a heterosexual man or woman and are sexually active with more than one partner. °¨ You take drugs by injection. °¨ You are sexually active with a partner who has HIV. °· Talk with your health care provider about whether you are at high risk of being infected with HIV. If you choose to begin PrEP, you should first  be tested for HIV. You should then be tested every 3 months for as long as you are taking PrEP.   °WHAT SHOULD I DO IF I THINK I HAVE AN STD? °· See your health care provider.   °· Tell your sexual partner(s). They should be tested and treated for any STDs. °· Do not have sex until your health care provider says it is okay.  °WHEN SHOULD I GET IMMEDIATE MEDICAL CARE? °Contact your health care provider right away if:  °· You have severe abdominal pain. °· You are a man and notice swelling or pain in your testicles. °· You are a woman and notice swelling or pain in your vagina. °Document Released: 05/19/2002 Document Revised: 03/03/2013 Document Reviewed: 09/16/2012 °ExitCare® Patient Information ©2015 [employer]. This information is not intended to replace advice given to you by your health care provider. Make sure you discuss any questions you have with your health care provider. ° °

## 2015-01-18 ENCOUNTER — Emergency Department (INDEPENDENT_AMBULATORY_CARE_PROVIDER_SITE_OTHER)
Admission: EM | Admit: 2015-01-18 | Discharge: 2015-01-18 | Disposition: A | Discharge status: Home / Self Care | Payer: Self-pay | Source: Home / Self Care

## 2015-01-18 ENCOUNTER — Encounter (HOSPITAL_COMMUNITY): Payer: Self-pay | Admitting: *Deleted

## 2015-01-18 DIAGNOSIS — Z202 Contact with and (suspected) exposure to infections with a predominantly sexual mode of transmission: Secondary | ICD-10-CM

## 2015-01-18 MED ORDER — AZITHROMYCIN 250 MG PO TABS
ORAL_TABLET | ORAL | Status: AC
  Filled 2015-01-18: qty 4

## 2015-01-18 MED ORDER — CEFTRIAXONE SODIUM 250 MG IJ SOLR
INTRAMUSCULAR | Status: AC
  Filled 2015-01-18: qty 250

## 2015-01-18 MED ORDER — AZITHROMYCIN 250 MG PO TABS
1000.0000 mg | ORAL_TABLET | Freq: Once | ORAL | Status: AC
  Administered 2015-01-18: 1000 mg via ORAL

## 2015-01-18 MED ORDER — CEFTRIAXONE SODIUM 250 MG IJ SOLR
250.0000 mg | Freq: Once | INTRAMUSCULAR | Status: AC
  Administered 2015-01-18: 250 mg via INTRAMUSCULAR

## 2015-01-18 NOTE — Discharge Instructions (Signed)
Gonorrhea Gonorrhea is an infection that can cause serious problems. If left untreated, the infection may:   Damage the male or male organs.   Cause women to be unable to have children (sterility).   Harm a fetus if the infected woman is pregnant.  It is important to get treatment for gonorrhea as soon as possible. It is also necessary that all your sexual partners be tested for the infection.  CAUSES  Gonorrhea is caused by bacteria called Neisseria gonorrhoeae. The infection is spread from person to person, usually by sexual contact (such as by anal, vaginal, or oral means). A newborn can contract the infection from his or her mother during birth.  RISK FACTORS  Being a woman younger than 31 years of age who is sexually active.  Being a woman 56 years of age or older who has:  A new sex partner.  More than one sex partner.  A sex partner who has a sexually transmitted disease (STD).  Using condoms inconsistently.  Currently having, or having previously had, an STD.  Exchanging sex or money or drugs. SYMPTOMS  Some people with gonorrhea do not have symptoms. Symptoms may be different in females and males.  Females The most common symptoms are:   Pain in the lower abdomen.   Fever with or without chills.  Other symptoms include:   Abnormal vaginal discharge.   Painful intercourse.   Burning or itching of the vagina or lips of the vagina.   Abnormal vaginal bleeding.   Pain when urinating.   Long-lasting (chronic) pain in the lower abdomen, especially during menstruation or intercourse.   Inability to become pregnant.   Going into premature labor.   Irritation, pain, bleeding, or discharge from the rectum. This may occur if the infection was spread by anal sex.   Sore throat or swollen lymph nodes in the neck. This may occur if the infection was spread by oral sex.  Males The most common symptoms are:   Discharge from the penis.   Pain  or burning during urination.   Pain or swelling in the testicles. Other symptoms may include:   Irritation, pain, bleeding, or discharge from the rectum. This may occur if the infection was spread by anal sex.   Sore throat, fever, or swollen lymph nodes in the neck. This may occur if the infection was spread by oral sex.  DIAGNOSIS  A diagnosis is made after a physical exam is done and a sample of discharge is examined under a microscope for the presence of the bacteria. The discharge may be taken from the urethra, cervix, throat, or rectum.  TREATMENT  Gonorrhea is treated with antibiotic medicines. It is important for treatment to begin as soon as possible. Early treatment may prevent some problems from developing. Do not have sex. Avoid all types of sexual activity for 7 days after treatment is complete and until any sex partners have been treated. HOME CARE INSTRUCTIONS   Take medicines only as directed by your health care provider.   Take your antibiotic medicine as directed by your health care provider. Finish the antibiotic even if you start to feel better. Incomplete treatment will put you at risk for continued infection.   Do not have sex until treatment is complete or as directed by your health care provider.   Keep all follow-up visits as directed by your health care provider.   Not all test results are available during your visit. If your test results are not back  during the visit, make an appointment with your health care provider to find out the results. Do not assume everything is normal if you have not heard from your health care provider or the medical facility. It is your responsibility to get your test results.  If you test positive for gonorrhea, inform your recent sexual partners. They need to be checked for gonorrhea even if they do not have symptoms. They may need treatment, even if they test negative for gonorrhea.  SEEK MEDICAL CARE IF:   You develop any  bad reaction to the medicine you were prescribed. This may include:   A rash.   Nausea.   Vomiting.   Diarrhea.   Your symptoms do not improve after a few days of taking antibiotics.   Your symptoms get worse.   You develop increased pain, such as in the testicles (for males) or in the abdomen (for females).  You have a fever. MAKE SURE YOU:   Understand these instructions.  Will watch your condition.  Will get help right away if you are not doing well or get worse.   This information is not intended to replace advice given to you by your health care provider. Make sure you discuss any questions you have with your health care provider.   Document Released: 02/24/2000 Document Revised: 03/19/2014 Document Reviewed: 09/03/2012 Elsevier Interactive Patient Education 2016 Elsevier Inc. Chlamydia, Male Chlamydia is an infection. It is spread through sexual contact. Chlamydia can be in different areas of the body. These areas include the urethra, throat, or rectum. It is important to treat chlamydia as soon as possible. It can damage other organs.  CAUSES  Chlamydia is caused by bacteria. It is a sexually transmitted disease. This means that it is passed from an infected partner during intimate contact. This contact could be with the genitals, mouth, or rectal area.  SIGNS AND SYMPTOMS  There may not be any symptoms. This is often the case early in the infection. If there are symptoms, they are usually mild and may only be noticeable in the morning. Symptoms you may notice include:   Burning with urination.  Pain or swelling in the testicles.  Watery mucus-like discharge from the penis.  Long-standing (chronic) pelvic pain after frequent infections.  Pain, swelling, or itching around the anus.  A sore throat.  Itching, burning, or redness in the eyes, or discharge from the eyes. DIAGNOSIS  To diagnose this infection, your health care provider will do a pelvic exam.  A sample of urine or a swab from the rectum may be taken for testing.  TREATMENT  Chlamydia is treated with antibiotic medicines. Your health care provider may test you for infection again 3 months after treatment. HOME CARE INSTRUCTIONS  Take your antibiotic medicine as directed by your health care provider. Finish the antibiotic even if you start to feel better. Incomplete treatment will put you at risk for not being able to have children (sterility).   Take medicines only as directed by your health care provider.   Rest.   Inform any sexual partners about your infection. Even if they are symptom free or have a negative culture or evaluation, they should be treated for the condition.   Do not have sex (intercourse) until treatment is completed and your health care provider says it is okay.   Keep all follow-up visits as directed by your health care provider.   Not all test results are available during your visit. If your test results are  not back during the visit, make an appointment with your health care provider to find out the results. Do not assume everything is normal if you have not heard from your health care provider or the medical facility. It is your responsibility to get your test results. SEEK MEDICAL CARE IF:  You develop new joint pain.  You have a fever. SEEK IMMEDIATE MEDICAL CARE IF:   Your pain increases.   You have abnormal discharge.   You have pain during intercourse. MAKE SURE YOU:   Understand these instructions.  Will watch your condition.  Will get help right away if you are not doing well or get worse.   This information is not intended to replace advice given to you by your health care provider. Make sure you discuss any questions you have with your health care provider.   Document Released: 02/26/2005 Document Revised: 03/19/2014 Document Reviewed: 09/04/2012 Elsevier Interactive Patient Education [employer]2016 Else[employer] Inc[employer] Group discussed  STD's, how they are prevented, transmitted, tested for, treated, and statistics around them.

## 2015-01-18 NOTE — ED Provider Notes (Signed)
CSN: [id number]     Arrival date & time 01/18/15  1514 History   None    Chief Complaint  Patient presents with  . SEXUALLY TRANSMITTED DISEASE   (Consider location/radiation/quality/duration/timing/severity/associated sxs/prior Treatment) HPI History obtained from patient:   LOCATION:penis SEVERITY:none DURATION:9 days ago CONTEXT:unprotected intercourse with new partner pt states he was called today by his partner and advised that she tested positive for GC /chlamydia QUALITY: MODIFYING FACTORS:none ASSOCIATED SYMPTOMS:crusty penile discharge TIMING:constant  History reviewed. No pertinent past medical history. History reviewed. No pertinent past surgical history. History reviewed. No pertinent family history. Social History  Substance Use Topics  . Smoking status: Current Every Day Smoker -- 0.50 packs/day    Types: Cigarettes  . Smokeless tobacco: Never Used  . Alcohol Use: Yes    Review of Systems ROS +'ve std exposure  Denies: HEADACHE, NAUSEA, ABDOMINAL PAIN, CHEST PAIN, CONGESTION, DYSURIA, SHORTNESS OF BREATH  Allergies  Penicillins  Home Medications   Prior to Admission medications   Medication Sig Start Date End Date Taking? Authorizing Provider  HYDROcodone-acetaminophen (NORCO/VICODIN) 5-325 MG per tablet Take 1-2 pills every 4-6 hours as needed for pain. Patient not taking: Reported on 11/10/2014 05/12/13   Junius FinnerErin O'Malley, PA-C  ibuprofen (ADVIL,MOTRIN) 200 MG tablet Take 200 mg by mouth every 6 (six) hours as needed (pain.).    Historical Provider, MD   Meds Ordered and Administered this Visit   Medications  azithromycin (ZITHROMAX) tablet 1,000 mg (not administered)  cefTRIAXone (ROCEPHIN) injection 250 mg (not administered)    BP 130/85 mmHg  Pulse 67  Temp(Src) 97.7 F (36.5 C) (Oral)  Resp 18  SpO2 98% No data found.   Physical Exam  Constitutional: He is oriented to person, place, and time. He appears well-developed and well-nourished.  No distress.  HENT:  Head: Normocephalic and atraumatic.  Eyes: Conjunctivae are normal.  Pulmonary/Chest: Effort normal.  Abdominal: Soft.  Genitourinary: Penis normal.  Musculoskeletal: Normal range of motion.  Neurological: He is alert and oriented to person, place, and time.  Skin: Skin is warm and dry.  Psychiatric: He has a normal mood and affect. His behavior is normal. Judgment and thought content normal.    ED Course  Procedures (including critical care time)  Labs Review Labs Reviewed - No data to display  Imaging Review No results found.   Visual Acuity Review  Right Eye Distance:   Left Eye Distance:   Bilateral Distance:    Right Eye Near:   Left Eye Near:    Bilateral Near:         MDM   1. Exposure to STD    Although tests results have not returned. Suggest treatment for STD. Ceftriaxone, azithromicin    Tharon AquasFrank C Zevin Nevares, GeorgiaPA 01/19/15 1315

## 2015-01-18 NOTE — ED Notes (Signed)
Pt   Reports    Penile discharge       And  He  Reports   He  Was  Told  He  Was  Exposed  To  Std    3   Days  Ago    Pt  States  Last  Unprotected    9  Days  Ago    -

## 2015-02-22 ENCOUNTER — Other Ambulatory Visit (HOSPITAL_COMMUNITY)
Admission: RE | Admit: 2015-02-22 | Discharge: 2015-02-22 | Disposition: A | Discharge status: Home / Self Care | Payer: Self-pay | Source: Ambulatory Visit | Attending: Emergency Medicine | Admitting: Emergency Medicine

## 2015-02-22 ENCOUNTER — Emergency Department (INDEPENDENT_AMBULATORY_CARE_PROVIDER_SITE_OTHER)
Admission: EM | Admit: 2015-02-22 | Discharge: 2015-02-22 | Disposition: A | Discharge status: Home / Self Care | Payer: Self-pay | Source: Home / Self Care | Attending: Emergency Medicine | Admitting: Emergency Medicine

## 2015-02-22 ENCOUNTER — Encounter (HOSPITAL_COMMUNITY): Payer: Self-pay | Admitting: Emergency Medicine

## 2015-02-22 DIAGNOSIS — Z113 Encounter for screening for infections with a predominantly sexual mode of transmission: Secondary | ICD-10-CM | POA: Insufficient documentation

## 2015-02-22 DIAGNOSIS — R369 Urethral discharge, unspecified: Secondary | ICD-10-CM

## 2015-02-22 LAB — POCT URINALYSIS DIP (DEVICE)
BILIRUBIN URINE: NEGATIVE
GLUCOSE, UA: NEGATIVE mg/dL
Hgb urine dipstick: NEGATIVE
KETONES UR: NEGATIVE mg/dL
Leukocytes, UA: NEGATIVE
NITRITE: NEGATIVE
Protein, ur: NEGATIVE mg/dL
Specific Gravity, Urine: 1.025 (ref 1.005–1.030)
Urobilinogen, UA: 0.2 mg/dL (ref 0.0–1.0)
pH: 6 (ref 5.0–8.0)

## 2015-02-22 MED ORDER — METRONIDAZOLE 500 MG PO TABS: 500.0000 mg | ORAL_TABLET | Freq: Two times a day (BID) | ORAL | Status: DC

## 2015-02-22 MED ORDER — CEFTRIAXONE SODIUM 250 MG IJ SOLR
250.0000 mg | Freq: Once | INTRAMUSCULAR | Status: AC
  Administered 2015-02-22: 250 mg via INTRAMUSCULAR

## 2015-02-22 MED ORDER — DOXYCYCLINE HYCLATE 100 MG PO CAPS: 100.0000 mg | ORAL_CAPSULE | Freq: Two times a day (BID) | ORAL | Status: DC

## 2015-02-22 MED ORDER — LIDOCAINE HCL (PF) 1 % IJ SOLN
INTRAMUSCULAR | Status: AC
  Filled 2015-02-22: qty 5

## 2015-02-22 MED ORDER — CEFTRIAXONE SODIUM 250 MG IJ SOLR
INTRAMUSCULAR | Status: AC
  Filled 2015-02-22: qty 250

## 2015-02-22 NOTE — Discharge Instructions (Signed)
We gave you a shot to treat gonorrhea. Take doxycycline twice a day for one week for chlamydia. Take Flagyl twice a day for one week for trichomonas. We will call you if any of your lab work comes back positive.

## 2015-02-22 NOTE — ED Provider Notes (Signed)
CSN: [id number]     Arrival date & time 02/22/15  1608 History   First MD Initiated Contact with Patient 02/22/15 1621     Chief Complaint  Patient presents with  . SEXUALLY TRANSMITTED DISEASE   (Consider location/radiation/quality/duration/timing/severity/associated sxs/prior Treatment) HPI  He is a 29 year old man here for evaluation for STD. He states about a month ago his partner told him she was positive for gonorrhea and chlamydia. He was seen here and treated with Rocephin and azithromycin. He states he continues to have some penile discharge. He denies any dysuria or abdominal pain. No fevers or chills. No rashes or lesions. He has had sexual relations with the same partner, but they used condoms.  History reviewed. No pertinent past medical history. History reviewed. No pertinent past surgical history. History reviewed. No pertinent family history. Social History  Substance Use Topics  . Smoking status: Current Every Day Smoker -- 0.50 packs/day    Types: Cigarettes  . Smokeless tobacco: Never Used  . Alcohol Use: Yes    Review of Systems As in history of present illness Allergies  Review of patient's allergies indicates no active allergies.  Home Medications   Prior to Admission medications   Medication Sig Start Date End Date Taking? Authorizing Provider  doxycycline (VIBRAMYCIN) 100 MG capsule Take 1 capsule (100 mg total) by mouth 2 (two) times daily. 02/22/15   Charm RingsErin J Oluwadamilola Rosamond, MD  ibuprofen (ADVIL,MOTRIN) 200 MG tablet Take 200 mg by mouth every 6 (six) hours as needed (pain.).    Historical Provider, MD  metroNIDAZOLE (FLAGYL) 500 MG tablet Take 1 tablet (500 mg total) by mouth 2 (two) times daily. 02/22/15   Charm RingsErin J Bernese Doffing, MD   Meds Ordered and Administered this Visit   Medications  cefTRIAXone (ROCEPHIN) injection 250 mg (not administered)    BP 153/77 mmHg  Pulse 72  Temp(Src) 98.1 F (36.7 C) (Oral)  Resp 20  SpO2 99% No data found.   Physical Exam   Constitutional: He is oriented to person, place, and time. He appears well-developed and well-nourished. No distress.  Cardiovascular: Normal rate.   Pulmonary/Chest: Effort normal.  Neurological: He is alert and oriented to person, place, and time.    ED Course  Procedures (including critical care time)  Labs Review Labs Reviewed  URINE CULTURE  HIV ANTIBODY (ROUTINE TESTING)  RPR  POCT URINALYSIS DIP (DEVICE)  URINE CYTOLOGY ANCILLARY ONLY    Imaging Review No results found.   MDM   1. Penile discharge    We'll treat with Rocephin here. Doxycycline and Flagyl for 1 week. Blood in urine sent for testing. Follow-up as needed.    Charm RingsErin J Zamoria Boss, MD 02/22/15 [phone]

## 2015-02-22 NOTE — ED Notes (Signed)
[facility] a [facility]. The patient stated that he was here on 01/18/15 for treatment but is still having symptoms.

## 2015-02-23 LAB — URINE CYTOLOGY ANCILLARY ONLY
Chlamydia: NEGATIVE
Neisseria Gonorrhea: NEGATIVE
TRICH (WINDOWPATH): NEGATIVE

## 2015-02-23 LAB — HIV ANTIBODY (ROUTINE TESTING W REFLEX): HIV SCREEN 4TH GENERATION: NONREACTIVE

## 2015-02-23 LAB — RPR: RPR: NONREACTIVE

## 2015-02-24 LAB — URINE CULTURE: Culture: 6000

## 2015-03-09 NOTE — ED Notes (Signed)
Patient called ; had questions about lab reports and continued syx. Advised to be rechecked if continues to be symptomatic

## 2015-08-23 ENCOUNTER — Encounter (HOSPITAL_COMMUNITY): Payer: Self-pay

## 2015-08-23 ENCOUNTER — Emergency Department (HOSPITAL_COMMUNITY)
Admission: EM | Admit: 2015-08-23 | Discharge: 2015-08-23 | Disposition: A | Discharge status: Home / Self Care | Payer: Self-pay | Attending: Emergency Medicine | Admitting: Emergency Medicine

## 2015-08-23 ENCOUNTER — Emergency Department (HOSPITAL_COMMUNITY): Payer: Self-pay

## 2015-08-23 DIAGNOSIS — S61209A Unspecified open wound of unspecified finger without damage to nail, initial encounter: Secondary | ICD-10-CM

## 2015-08-23 DIAGNOSIS — Y929 Unspecified place or not applicable: Secondary | ICD-10-CM | POA: Insufficient documentation

## 2015-08-23 DIAGNOSIS — Z791 Long term (current) use of non-steroidal anti-inflammatories (NSAID): Secondary | ICD-10-CM | POA: Insufficient documentation

## 2015-08-23 DIAGNOSIS — S61002A Unspecified open wound of left thumb without damage to nail, initial encounter: Secondary | ICD-10-CM | POA: Insufficient documentation

## 2015-08-23 DIAGNOSIS — Y999 Unspecified external cause status: Secondary | ICD-10-CM | POA: Insufficient documentation

## 2015-08-23 DIAGNOSIS — F17210 Nicotine dependence, cigarettes, uncomplicated: Secondary | ICD-10-CM | POA: Insufficient documentation

## 2015-08-23 DIAGNOSIS — Y939 Activity, unspecified: Secondary | ICD-10-CM | POA: Insufficient documentation

## 2015-08-23 DIAGNOSIS — W290XXA Contact with powered kitchen appliance, initial encounter: Secondary | ICD-10-CM | POA: Insufficient documentation

## 2015-08-23 DIAGNOSIS — Z79899 Other long term (current) drug therapy: Secondary | ICD-10-CM | POA: Insufficient documentation

## 2015-08-23 MED ORDER — ACETAMINOPHEN 325 MG PO TABS
650.0000 mg | ORAL_TABLET | Freq: Once | ORAL | Status: AC
  Administered 2015-08-23: 650 mg via ORAL
  Filled 2015-08-23: qty 2

## 2015-08-23 MED ORDER — TETANUS-DIPHTH-ACELL PERTUSSIS 5-2.5-18.5 LF-MCG/0.5 IM SUSP
0.5000 mL | Freq: Once | INTRAMUSCULAR | Status: AC
  Administered 2015-08-23: 0.5 mL via INTRAMUSCULAR
  Filled 2015-08-23: qty 0.5

## 2015-08-23 MED ORDER — IBUPROFEN 400 MG PO TABS
800.0000 mg | ORAL_TABLET | Freq: Once | ORAL | Status: AC
  Administered 2015-08-23: 800 mg via ORAL
  Filled 2015-08-23: qty 2

## 2015-08-23 NOTE — ED Notes (Signed)
Patient here with right thumb laceration, cut tip of thumb with slicer, skin missing from same. Saline dressing applied on arrival

## 2015-08-23 NOTE — ED Provider Notes (Signed)
CSN: [id number]     Arrival date & time 08/23/15  1003 History  By signing my name below, I, Marisue Humble, attest that this documentation has been prepared under the direction and in the presence of non-physician practitioner, Cheri Fowler, PA-C. Electronically Signed: Marisue Humble, Scribe. 08/23/2015. 11:56 AM.   Chief Complaint  Patient presents with  . finger laceration     The history is provided by the patient. No language interpreter was used.   HPI Comments:  Andrew Preston is a 30 y.o. male who presents to the Emergency Department complaining of painful laceration to tip of right thumb. Pt states he cut the tip of his thumb with a meat slicer ~2 hours ago. Bleeding controlled and bandage in place on exam. Unsure of last Tetanus. No alleviating factors noted or treatments attempted PTA. Denies use of anticoagulants.  No other PMH.   History reviewed. No pertinent past medical history. History reviewed. No pertinent past surgical history. No family history on file. Social History  Substance Use Topics  . Smoking status: Current Every Day Smoker -- 0.50 packs/day    Types: Cigarettes  . Smokeless tobacco: Never Used  . Alcohol Use: Yes    Review of Systems  Constitutional: Negative for fever.  Skin: Positive for wound.  Neurological: Negative for weakness and numbness.  All other systems reviewed and are negative.   Allergies  Review of patient's allergies indicates no known allergies.  Home Medications   Prior to Admission medications   Medication Sig Start Date End Date Taking? Authorizing Provider  doxycycline (VIBRAMYCIN) 100 MG capsule Take 1 capsule (100 mg total) by mouth 2 (two) times daily. 02/22/15   Charm Rings, MD  ibuprofen (ADVIL,MOTRIN) 200 MG tablet Take 200 mg by mouth every 6 (six) hours as needed (pain.).    Historical Provider, MD  metroNIDAZOLE (FLAGYL) 500 MG tablet Take 1 tablet (500 mg total) by mouth 2 (two) times daily. 02/22/15   Charm Rings, MD   BP 118/76 mmHg  Pulse 116  Temp(Src) 98.6 F (37 C) (Oral)  Resp 16  SpO2 98%   Physical Exam  Constitutional: He is oriented to person, place, and time. He appears well-developed and well-nourished.  HENT:  Head: Normocephalic and atraumatic.  Eyes: Conjunctivae are normal.  Neck: Normal range of motion.  Cardiovascular:  Capillary refill less than 3 seconds distal to injury.   Pulmonary/Chest: Effort normal. No respiratory distress.  Abdominal: He exhibits no distension.  Musculoskeletal:  FAROM of left thumb at MCP and DIPJ.  Neurological: He is alert and oriented to person, place, and time.  Strength and sensation intact distal to injury.  Skin: Skin is warm and dry.  <1 cm left thumb tip avulsion.  No nail bed involvement.  Bleeding controlled with pressure. No foreign bodies visualized or palpated in a bloodless field.     ED Course  Procedures  DIAGNOSTIC STUDIES:  Oxygen Saturation is 98% on RA, normal by my interpretation.    COORDINATION OF CARE:  11:50 AM Will clean and apply dressing to thumb. Will update Tetanus and administer pain medication. Discussed treatment plan with pt at bedside and pt agreed to plan.  Labs Review Labs Reviewed - No data to display  Imaging Review Dg Finger Thumb Right  08/23/2015  CLINICAL DATA:  Patient cut distal aspect of thumb with knife earlier today EXAM: RIGHT THUMB 2+V COMPARISON:  None. FINDINGS: Frontal, oblique, and lateral views were obtained. There is an  overlying bandage. No other radiopaque foreign body seen. There is no fracture or dislocation. Joint spaces appear normal. No erosive change. IMPRESSION: Overlying bandage. No other radiopaque foreign body. No bony abnormality. No appreciable arthropathic change. Electronically Signed   By: Bretta BangWilliam  Woodruff III M.D.   On: 08/23/2015 11:23   I have personally reviewed and evaluated these images and lab results as part of my medical decision-making.   EKG  Interpretation None      MDM   Final diagnoses:  Avulsion, finger tip, initial encounter  No indication for repair.  No indication for ppx abx at this time.  No other comorbid medical conditions.  Neurovascularly intact.  Tetanus updated.  Ibuprofen and Tylenol for pain.  Plain films obtained in triage, unremarkable.  Wound cleaned.  Dressing applied.  Follow up 2-3 days for wound check with PCP.  Recommend keeping wound clean and dry and changing dressing every 1-2 days until healed.  I personally performed the services described in this documentation, which was scribed in my presence. The recorded information has been reviewed and is accurate.      Cheri FowlerKayla Celicia Minahan, PA-C 08/23/15 1237  Pricilla LovelessScott Goldston, MD 08/28/15 [phone]

## 2015-08-23 NOTE — Discharge Instructions (Signed)
Keep bandage on for 2 days.  Return to the ED for wound recheck.  Afterwards, keep your wound clean and dry.  Change dressing daily or every other day.  You may take Ibuprofen or Tylenol for pain.  Return to the ED if you experience fever, increased swelling, pus/drainage from the wound, or your pain is not controlled.  Nonsutured Laceration Care  A laceration is a cut that goes through all layers of the skin and extends into the tissue that is right under the skin. This type of cut is usually stitched up (sutured) or closed with tape (adhesive strips) or skin glue shortly after the injury happens.  However, if the wound is dirty or if several hours pass before medical treatment is provided, it is likely that germs (bacteria) will enter the wound. Closing a laceration after bacteria have entered it increases the risk of infection. In these cases, your health care provider may leave the laceration open (nonsutured) and cover it with a bandage. This type of treatment helps prevent infection and allows the wound to heal from the deepest layer of tissue damage up to the surface.  An open fracture is a type of injury that may involve nonsutured lacerations. An open fracture is a break in a bone that happens along with one or more lacerations through the skin that is near the fracture site.  HOW TO CARE FOR YOUR NONSUTURED LACERATION  Take or apply over-the-counter and prescription medicines only as told by your health care provider.  If you were prescribed an antibiotic medicine, take or apply it as told by your health care provider. Do not stop using the antibiotic even if your condition improves.  Clean the wound one time each day or as told by your health care provider.  Wash the wound with mild soap and water.  Rinse the wound with water to remove all soap.  Pat your wound dry with a clean towel. Do not rub the wound. Do not inject anything into the wound unless your health care provider told you to.    Change any bandages (dressings) as told by your health care provider. This includes changing the dressing if it gets wet, dirty, or starts to smell bad.  Keep the dressing dry until your health care provider says it can be removed. Do not take baths, swim, or do anything that puts your wound underwater until your health care provider approves.  Raise (elevate) the injured area above the level of your heart while you are sitting or lying down, if possible.  Do not scratch or pick at the wound.  Check your wound every day for signs of infection. Watch for:  Redness, swelling, or pain.  Fluid, blood, or pus. Keep all follow-up visits as told by your health care provider. This is important. SEEK MEDICAL CARE IF:  You received a tetanus and shot and you have swelling, severe pain, redness, or bleeding at the injection site.  You have a fever.  Your pain is not controlled with medicine.  You have increased redness, swelling, or pain at the site of your wound.  You have fluid, blood, or pus coming from your wound.  You notice a bad smell coming from your wound or your dressing.  You notice something coming out of the wound, such as wood or glass.  You notice a change in the color of your skin near your wound.  You develop a new rash.  You need to change the dressing frequently due  to fluid, blood, or pus draining from the wound.  You develop numbness around your wound. SEEK IMMEDIATE MEDICAL CARE IF:  Your pain suddenly increases and is severe.  You develop severe swelling around the wound.  The wound is on your hand or foot and you cannot properly move a finger or toe.  The wound is on your hand or foot and you notice that your fingers or toes look pale or bluish.  You have a red streak going away from your wound. This information is not intended to replace advice given to you by your health care provider. Make sure you discuss any questions you have with your health care provider.  Document  Released: 01/24/2006 Document Revised: 07/13/2014 Document Reviewed: 02/22/2014  Elsevier Interactive Patient Education [employer]2016 [employer]

## 2017-01-01 ENCOUNTER — Emergency Department
Admission: EM | Admit: 2017-01-01 | Discharge: 2017-01-01 | Disposition: A | Discharge status: Home / Self Care | Payer: Self-pay | Attending: Emergency Medicine | Admitting: Emergency Medicine

## 2017-01-01 ENCOUNTER — Encounter: Payer: Self-pay | Admitting: Emergency Medicine

## 2017-01-01 DIAGNOSIS — Z79899 Other long term (current) drug therapy: Secondary | ICD-10-CM | POA: Insufficient documentation

## 2017-01-01 DIAGNOSIS — Z202 Contact with and (suspected) exposure to infections with a predominantly sexual mode of transmission: Secondary | ICD-10-CM | POA: Insufficient documentation

## 2017-01-01 DIAGNOSIS — F17210 Nicotine dependence, cigarettes, uncomplicated: Secondary | ICD-10-CM | POA: Insufficient documentation

## 2017-01-01 LAB — URINALYSIS, COMPLETE (UACMP) WITH MICROSCOPIC
BACTERIA UA: NONE SEEN
Bilirubin Urine: NEGATIVE
Glucose, UA: NEGATIVE mg/dL
HGB URINE DIPSTICK: NEGATIVE
Ketones, ur: 5 mg/dL — AB
Leukocytes, UA: NEGATIVE
NITRITE: NEGATIVE
PROTEIN: NEGATIVE mg/dL
Specific Gravity, Urine: 1.019 (ref 1.005–1.030)
pH: 6 (ref 5.0–8.0)

## 2017-01-01 LAB — CHLAMYDIA/NGC RT PCR (ARMC ONLY)
Chlamydia Tr: NOT DETECTED
N gonorrhoeae: NOT DETECTED

## 2017-01-01 MED ORDER — AZITHROMYCIN 500 MG PO TABS
1000.0000 mg | ORAL_TABLET | Freq: Once | ORAL | Status: AC
  Administered 2017-01-01: 1000 mg via ORAL
  Filled 2017-01-01: qty 2

## 2017-01-01 MED ORDER — METRONIDAZOLE 500 MG PO TABS
2000.0000 mg | ORAL_TABLET | Freq: Once | ORAL | Status: AC
  Administered 2017-01-01: 2000 mg via ORAL
  Filled 2017-01-01: qty 4

## 2017-01-01 MED ORDER — CEFTRIAXONE SODIUM 250 MG IJ SOLR
250.0000 mg | Freq: Once | INTRAMUSCULAR | Status: AC
  Administered 2017-01-01: 250 mg via INTRAMUSCULAR
  Filled 2017-01-01: qty 250

## 2017-01-01 NOTE — ED Notes (Signed)
Patient does not appear to be in any acute distress at time of discharge. Patient ambulatory to lobby with steady gate. Patient denies any comments or concerns regarding discharge.  

## 2017-01-01 NOTE — ED Notes (Signed)
Patient denies pain and is resting comfortably.  

## 2017-01-01 NOTE — ED Triage Notes (Signed)
Patient presents to ED via POV from home. Patient states, "my girl told me I need to come and get treated for chlamydia and gonorrhea". Patient denies any symptoms at this time.

## 2017-01-01 NOTE — ED Provider Notes (Signed)
[facility] Emergency Department Provider Note  ____________________________________________  Time seen: Approximately 1:29 PM  I have reviewed the triage vital signs and the nursing notes.   HISTORY  Chief Complaint SEXUALLY TRANSMITTED DISEASE   HPI Andrew Preston is a 31 y.o. male that presents to the emergency department for STD exposure. Patient states that he was told by a partner this morning that she tested positive for gonorrhea and chlamydia. He is asymptomatic. No fever, nausea, vomiting, abdominal pain, penile discharge, dysuria, rash.   History reviewed. No pertinent past medical history.  There are no active problems to display for this patient.   History reviewed. No pertinent surgical history.  Prior to Admission medications   Medication Sig Start Date End Date Taking? Authorizing Provider  doxycycline (VIBRAMYCIN) 100 MG capsule Take 1 capsule (100 mg total) by mouth 2 (two) times daily. 02/22/15   Charm RingsHonig, Erin J, MD  ibuprofen (ADVIL,MOTRIN) 200 MG tablet Take 200 mg by mouth every 6 (six) hours as needed (pain.).    [provider]  metroNIDAZOLE (FLAGYL) 500 MG tablet Take 1 tablet (500 mg total) by mouth 2 (two) times daily. 02/22/15   Charm RingsHonig, Erin J, MD    Allergies Patient has no known allergies.  No family history on file.  Social History Social History  Substance Use Topics  . Smoking status: Current Every Day Smoker    Packs/day: 0.50    Types: Cigarettes  . Smokeless tobacco: Never Used  . Alcohol use Yes     Comment: Soical     Review of Systems  Constitutional: No fever/chills Cardiovascular: No chest pain. Respiratory: No SOB. Gastrointestinal: No abdominal pain.  No nausea, no vomiting.  Genitourinary: Negative for dysuria. Skin: Negative for rash, abrasions, lacerations, ecchymosis.   ____________________________________________   PHYSICAL EXAM:  VITAL SIGNS: ED Triage Vitals [01/01/17 1207]   Enc Vitals Group     BP 112/65     Pulse Rate 99     Resp 17     Temp      Temp src      SpO2 98 %     Weight 155 lb (70.3 kg)     Height 5\' 10"  (1.778 m)     Head Circumference      Peak Flow      Pain Score      Pain Loc      Pain Edu?      Excl. in GC?      Constitutional: Alert and oriented. Well appearing and in no acute distress. Eyes: Conjunctivae are normal. PERRL. EOMI. Head: Atraumatic. ENT:      Ears:      Nose: No congestion/rhinnorhea.      Mouth/Throat: Mucous membranes are moist.  Neck: No stridor.   Cardiovascular:  Good peripheral circulation. Respiratory: Normal respiratory effort without tachypnea or retractions.  Musculoskeletal: Full range of motion to all extremities. No gross deformities appreciated. Neurologic:  Normal speech and language. No gross focal neurologic deficits are appreciated.  Skin:  Skin is warm, dry and intact. No rash noted.  ____________________________________________   LABS (all labs ordered are listed, but only abnormal results are displayed)  Labs Reviewed  URINALYSIS, COMPLETE (UACMP) WITH MICROSCOPIC - Abnormal; Notable for the following:       Result Value   Color, Urine YELLOW (*)    APPearance CLEAR (*)    Ketones, ur 5 (*)    Squamous Epithelial / LPF 0-5 (*)  All other components within normal limits  CHLAMYDIA/NGC RT PCR ([facility] ONLY)   ____________________________________________  EKG   ____________________________________________  RADIOLOGY  No results found.  ____________________________________________    PROCEDURES  Procedure(s) performed:    Procedures    Medications  azithromycin (ZITHROMAX) tablet 1,000 mg (1,000 mg Oral Given 01/01/17 1338)  cefTRIAXone (ROCEPHIN) injection 250 mg (250 mg Intramuscular Given 01/01/17 1341)  metroNIDAZOLE (FLAGYL) tablet 2,000 mg (2,000 mg Oral Given 01/01/17 1338)     ____________________________________________   INITIAL  IMPRESSION / ASSESSMENT AND PLAN / ED COURSE  Pertinent labs & imaging results that were available during my care of the patient were reviewed by me and considered in my medical decision making (see chart for details).  Review of the [place] CSRS was performed in accordance of the [facility] prior to dispensing any controlled drugs.    Patient's diagnosis is consistent with exposure to STD. Vital  signs and exam are reassuring. Patient will be treated based on exposure before gonorrhea or chlamydia results. Patient was given IM Rocephin, oral azithromycin, and Flagyl in ED. STD education was provided. Patient is to follow up with health department as directed. Patient is given ED precautions to return to the ED for any worsening or new symptoms.     ____________________________________________  FINAL CLINICAL IMPRESSION(S) / ED DIAGNOSES  Final diagnoses:  STD exposure      NEW MEDICATIONS STARTED DURING THIS VISIT:  Discharge Medication List as of 01/01/2017  1:44 PM          This chart was dictated using voice recognition software/Dragon. Despite best efforts to proofread, errors can occur which can change the meaning. Any change was purely unintentional.    Enid Derry, PA-C 01/01/17 1443    Sharman Cheek, MD 01/03/17 [phone]

## 2017-01-31 IMAGING — DX DG FINGER THUMB 2+V*R*
3 series · 3 of 3 positions shown · non-contrast
Comparison: None.

CLINICAL DATA: Patient cut distal aspect of thumb with knife
earlier today

EXAM:
RIGHT THUMB 2+V

[finger ap]
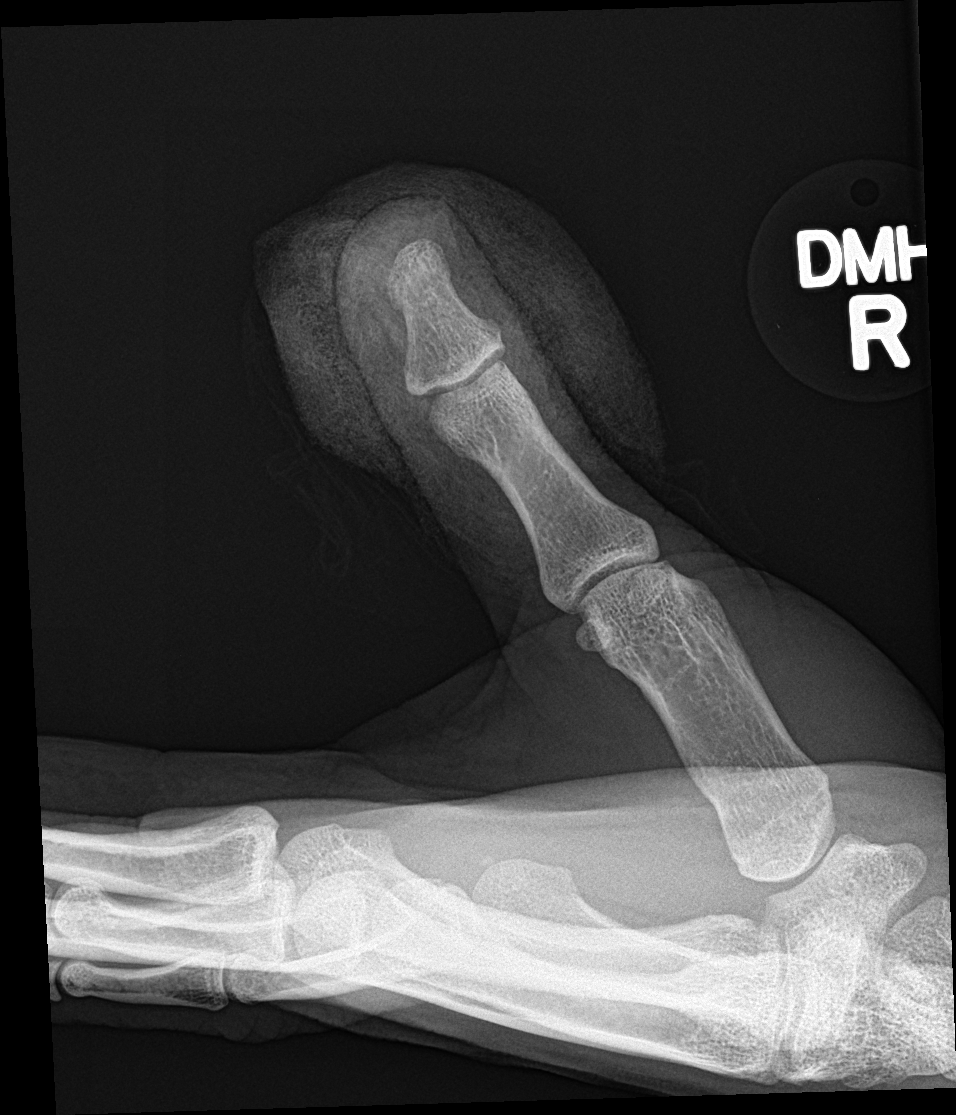

[finger obl]
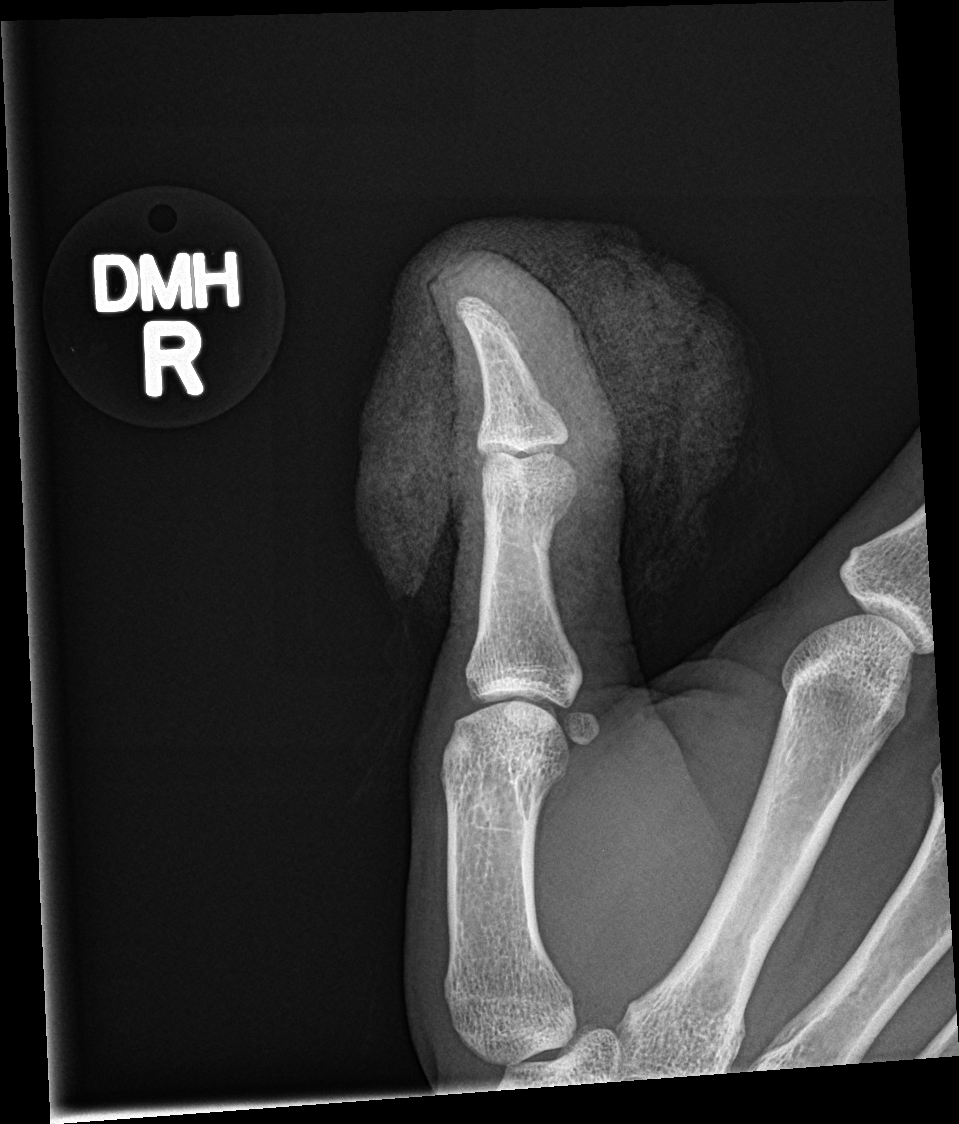

[finger lat]
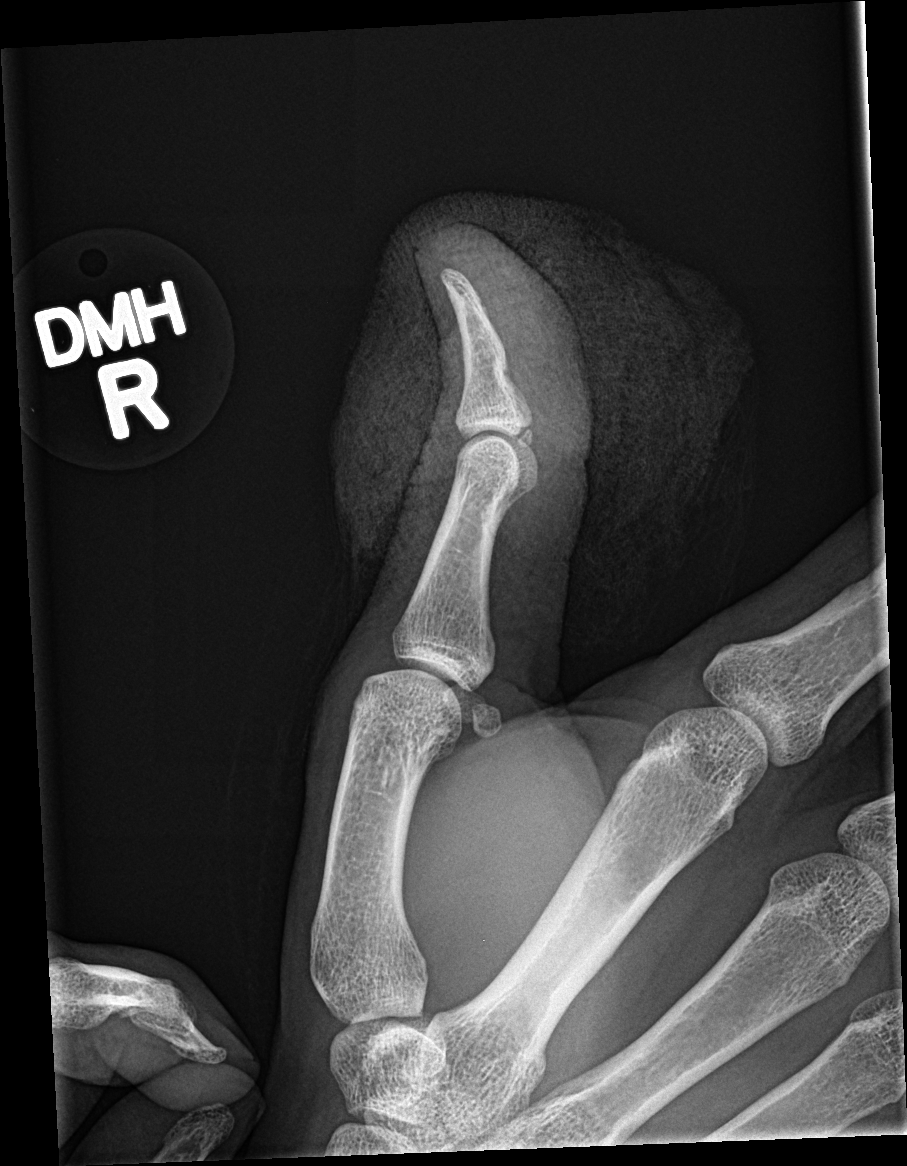

[3 of 3 positions shown; findings below may reference images not displayed]

FINDINGS: Frontal, oblique, and lateral views were obtained. There is an
overlying bandage. No other radiopaque foreign body seen. There is
no fracture or dislocation. Joint spaces appear normal. No erosive
change.
IMPRESSION: Overlying bandage. No other radiopaque foreign body. No bony
abnormality. No appreciable arthropathic change.

## 2018-03-13 ENCOUNTER — Encounter (HOSPITAL_COMMUNITY): Payer: Self-pay

## 2018-03-13 ENCOUNTER — Ambulatory Visit (HOSPITAL_COMMUNITY)
Admission: EM | Admit: 2018-03-13 | Discharge: 2018-03-13 | Disposition: A | Discharge status: Home / Self Care | Payer: BLUE CROSS/BLUE SHIELD | Attending: Family Medicine | Admitting: Family Medicine

## 2018-03-13 DIAGNOSIS — Z202 Contact with and (suspected) exposure to infections with a predominantly sexual mode of transmission: Secondary | ICD-10-CM | POA: Diagnosis present

## 2018-03-13 DIAGNOSIS — F17210 Nicotine dependence, cigarettes, uncomplicated: Secondary | ICD-10-CM | POA: Insufficient documentation

## 2018-03-13 DIAGNOSIS — R300 Dysuria: Secondary | ICD-10-CM

## 2018-03-13 MED ORDER — CEFTRIAXONE SODIUM 250 MG IJ SOLR
INTRAMUSCULAR | Status: AC
  Filled 2018-03-13: qty 250

## 2018-03-13 MED ORDER — AZITHROMYCIN 250 MG PO TABS
ORAL_TABLET | ORAL | Status: AC
  Filled 2018-03-13: qty 4

## 2018-03-13 MED ORDER — CEFTRIAXONE SODIUM 250 MG IJ SOLR
250.0000 mg | Freq: Once | INTRAMUSCULAR | Status: AC
  Administered 2018-03-13: 250 mg via INTRAMUSCULAR

## 2018-03-13 MED ORDER — METRONIDAZOLE 500 MG PO TABS: 2000.0000 mg | ORAL_TABLET | Freq: Once | ORAL | 0 refills | Status: AC

## 2018-03-13 MED ORDER — AZITHROMYCIN 250 MG PO TABS
1000.0000 mg | ORAL_TABLET | Freq: Once | ORAL | Status: AC
  Administered 2018-03-13: 1000 mg via ORAL

## 2018-03-13 NOTE — Discharge Instructions (Signed)
We have treated you today for gonorrhea and chlamydia, with azithromycin and Rocephin. Please refrain from sexual activity for 7 days while medicine is clearing infection.  I have sent in 4 tablets of metronidazole for you to take if results for trichomonas come back positive.  We are testing you for Gonorrhea, Chlamydia and Trichomonas. We will call you if anything is positive and let you know if you require any further treatment. Please inform partner of any positive results.  Please return if symptoms not improving with treatment, development of fever, nausea, vomiting, abdominal pain, scrotal pain.

## 2018-03-13 NOTE — ED Triage Notes (Signed)
Pt presents for STD Testing after exposure.  Pt states he is having some penile burning and discomfort.

## 2018-03-14 LAB — URINE CYTOLOGY ANCILLARY ONLY
Chlamydia: NEGATIVE
Neisseria Gonorrhea: NEGATIVE
TRICH (WINDOWPATH): NEGATIVE

## 2018-03-14 NOTE — ED Provider Notes (Signed)
[facility]    CSN: [id number] Arrival date & time: 03/13/18  1122     History   Chief Complaint Chief Complaint  Patient presents with  . Exposure to STD    HPI Andrew Preston is a 33 y.o. male no significant past medical history presenting today for evaluation of STD screening.  Patient states that her partner reported to him that he needed to be checked for STDs.  States that they were treated for gonorrhea, chlamydia and trichomonas.  He is unsure if they were tested positive.  He has noticed some slight discomfort and dysuria with urination.  Denies penile discharge.  Denies rashes or lesions.  Denies any abdominal pain or fevers.  HPI  History reviewed. No pertinent past medical history.  There are no active problems to display for this patient.   History reviewed. No pertinent surgical history.     Home Medications    Prior to Admission medications   Medication Sig Start Date End Date Taking? Authorizing Provider  ibuprofen (ADVIL,MOTRIN) 200 MG tablet Take 200 mg by mouth every 6 (six) hours as needed (pain.).    [provider]    Family History Family History  Problem Relation Age of Onset  . Healthy Mother   . Healthy Father     Social History Social History   Tobacco Use  . Smoking status: Current Every Day Smoker    Packs/day: 0.50    Types: Cigarettes  . Smokeless tobacco: Never Used  Substance Use Topics  . Alcohol use: Yes    Comment: Soical  . Drug use: No     Allergies   Patient has no known allergies.   Review of Systems Review of Systems  Constitutional: Negative for fever.  HENT: Negative for sore throat.   Respiratory: Negative for shortness of breath.   Cardiovascular: Negative for chest pain.  Gastrointestinal: Negative for abdominal pain, nausea and vomiting.  Genitourinary: Positive for dysuria. Negative for difficulty urinating, discharge, frequency, penile pain, penile swelling, scrotal swelling and  testicular pain.  Skin: Negative for rash.  Neurological: Negative for dizziness, light-headedness and headaches.     Physical Exam Triage Vital Signs ED Triage Vitals  Enc Vitals Group     BP 03/13/18 1319 122/76     Pulse Rate 03/13/18 1319 76     Resp 03/13/18 1319 20     Temp 03/13/18 1319 98.1 F (36.7 C)     Temp Source 03/13/18 1319 Oral     SpO2 03/13/18 1319 98 %     Weight --      Height --      Head Circumference --      Peak Flow --      Pain Score 03/13/18 1321 4     Pain Loc --      Pain Edu? --      Excl. in GC? --    No data found.  Updated Vital Signs BP 122/76 (BP Location: Left Arm)   Pulse 76   Temp 98.1 F (36.7 C) (Oral)   Resp 20   SpO2 98%   Visual Acuity Right Eye Distance:   Left Eye Distance:   Bilateral Distance:    Right Eye Near:   Left Eye Near:    Bilateral Near:     Physical Exam Vitals signs and nursing note reviewed.  Constitutional:      Appearance: He is well-developed.     Comments: No acute distress  HENT:  Head: Normocephalic and atraumatic.     Nose: Nose normal.  Eyes:     Conjunctiva/sclera: Conjunctivae normal.  Neck:     Musculoskeletal: Neck supple.  Cardiovascular:     Rate and Rhythm: Normal rate.  Pulmonary:     Effort: Pulmonary effort is normal. No respiratory distress.  Abdominal:     General: There is no distension.  Musculoskeletal: Normal range of motion.  Skin:    General: Skin is warm and dry.  Neurological:     Mental Status: He is alert and oriented to person, place, and time.      UC Treatments / Results  Labs (all labs ordered are listed, but only abnormal results are displayed) Labs Reviewed  URINE CYTOLOGY ANCILLARY ONLY    EKG None  Radiology No results found.  Procedures Procedures (including critical care time)  Medications Ordered in UC Medications  cefTRIAXone (ROCEPHIN) injection 250 mg (250 mg Intramuscular Given 03/13/18 1343)  azithromycin (ZITHROMAX)  tablet 1,000 mg (1,000 mg Oral Given 03/13/18 1342)    Initial Impression / Assessment and Plan / UC Course  I have reviewed the triage vital signs and the nursing notes.  Pertinent labs & imaging results that were available during my care of the patient were reviewed by me and considered in my medical decision making (see chart for details).     Urine cytology obtained, will send off to confirm results.  Will empirically treat for gonorrhea and chlamydia today in clinic.  Will send home with 2 g Flagyl to take later on in order to avoid significant upset stomach with azithromycin and Flagyl large doses.  Will call patient with results and provide any further treatment if needed.  Answered patient questions.Discussed strict return precautions. Patient verbalized understanding and is agreeable with plan.  Final Clinical Impressions(s) / UC Diagnoses   Final diagnoses:  Exposure to STD  Dysuria     Discharge Instructions     We have treated you today for gonorrhea and chlamydia, with azithromycin and Rocephin. Please refrain from sexual activity for 7 days while medicine is clearing infection.  I have sent in 4 tablets of metronidazole for you to take if results for trichomonas come back positive.  We are testing you for Gonorrhea, Chlamydia and Trichomonas. We will call you if anything is positive and let you know if you require any further treatment. Please inform partner of any positive results.  Please return if symptoms not improving with treatment, development of fever, nausea, vomiting, abdominal pain, scrotal pain.   ED Prescriptions    Medication Sig Dispense Auth. Provider   metroNIDAZOLE (FLAGYL) 500 MG tablet Take 4 tablets (2,000 mg total) by mouth once for 1 dose. 4 tablet Wieters, Hallie C, PA-C     Controlled Substance Prescriptions [place] Controlled Substance Registry consulted? Not Applicable   Lew Dawes, [place] 03/14/18 1237

## 2018-05-13 ENCOUNTER — Encounter (HOSPITAL_COMMUNITY): Payer: Self-pay

## 2018-05-13 ENCOUNTER — Emergency Department (HOSPITAL_COMMUNITY)
Admission: EM | Admit: 2018-05-13 | Discharge: 2018-05-13 | Disposition: A | Discharge status: Home / Self Care | Payer: BLUE CROSS/BLUE SHIELD | Attending: Emergency Medicine | Admitting: Emergency Medicine

## 2018-05-13 ENCOUNTER — Other Ambulatory Visit: Payer: Self-pay

## 2018-05-13 DIAGNOSIS — R55 Syncope and collapse: Secondary | ICD-10-CM

## 2018-05-13 DIAGNOSIS — F17210 Nicotine dependence, cigarettes, uncomplicated: Secondary | ICD-10-CM | POA: Diagnosis not present

## 2018-05-13 LAB — BASIC METABOLIC PANEL
ANION GAP: 6 (ref 5–15)
BUN: 9 mg/dL (ref 6–20)
CO2: 27 mmol/L (ref 22–32)
Calcium: 8.8 mg/dL — ABNORMAL LOW (ref 8.9–10.3)
Chloride: 105 mmol/L (ref 98–111)
Creatinine, Ser: 0.98 mg/dL (ref 0.61–1.24)
GFR calc Af Amer: >60 mL/min (ref >60)
GFR calc non Af Amer: >60 mL/min (ref >60)
Glucose, Bld: 97 mg/dL (ref 70–99)
Potassium: 3.9 mmol/L (ref 3.5–5.1)
SODIUM: 138 mmol/L (ref 135–145)

## 2018-05-13 LAB — CBC
HCT: 47.7 % (ref 39.0–52.0)
Hemoglobin: 15.4 g/dL (ref 13.0–17.0)
MCH: 30.7 pg (ref 26.0–34.0)
MCHC: 32.3 g/dL (ref 30.0–36.0)
MCV: 95.2 fL (ref 80.0–100.0)
Platelets: 244 10*3/uL (ref 150–400)
RBC: 5.01 MIL/uL (ref 4.22–5.81)
RDW: 11.9 % (ref 11.5–15.5)
WBC: 12.9 10*3/uL — ABNORMAL HIGH (ref 4.0–10.5)
nRBC: 0 % (ref 0.0–0.2)

## 2018-05-13 LAB — URINALYSIS, ROUTINE W REFLEX MICROSCOPIC
Bilirubin Urine: NEGATIVE
Glucose, UA: NEGATIVE mg/dL
Hgb urine dipstick: NEGATIVE
Ketones, ur: NEGATIVE mg/dL
Leukocytes,Ua: NEGATIVE
Nitrite: NEGATIVE
PROTEIN: NEGATIVE mg/dL
Specific Gravity, Urine: 1.015 (ref 1.005–1.030)
pH: 6 (ref 5.0–8.0)

## 2018-05-13 LAB — CBG MONITORING, ED: Glucose-Capillary: 90 mg/dL (ref 70–99)

## 2018-05-13 MED ORDER — SODIUM CHLORIDE 0.9% FLUSH: 3.0000 mL | Freq: Once | INTRAVENOUS | Status: DC

## 2018-05-13 NOTE — ED Provider Notes (Addendum)
[facility] EMERGENCY DEPARTMENT Provider Note   CSN: [id number] Arrival date & time: 05/13/18  2010    History   Chief Complaint Chief Complaint  Patient presents with  . Weakness  . Loss of Consciousness    HPI Andrew Preston is a 33 y.o. male.     Patient presents s/p syncopal episode. Patient indicates had stood up to go to kitchen, was feeling nauseated and general abd cramping as if had to have BM, then felt generally weak, and had syncopal event. No associated cp or discomfort. No palpitations or sense of fast or irregular heartbeat. No headache. Patient indicates then did have bm, and subsequently felt better. Currently feels fine, no symptoms. No headache. No neck or back pain. No abd pain. No nv. Denies fever or chills. Denies rectal bleeding or melena. No hx syncope. No new medication use.   The history is provided by the patient.  Weakness  Associated symptoms: syncope   Associated symptoms: no chest pain, no diarrhea, no dysuria, no fever, no headaches, no shortness of breath and no vomiting   Loss of Consciousness  Associated symptoms: no chest pain, no confusion, no fever, no headaches, no shortness of breath, no vomiting and no weakness     History reviewed. No pertinent past medical history.  There are no active problems to display for this patient.   History reviewed. No pertinent surgical history.      Home Medications    Prior to Admission medications   Not on File    Family History Family History  Problem Relation Age of Onset  . Healthy Mother   . Healthy Father     Social History Social History   Tobacco Use  . Smoking status: Current Every Day Smoker    Packs/day: 0.50    Types: Cigarettes  . Smokeless tobacco: Never Used  Substance Use Topics  . Alcohol use: Yes    Comment: Soical  . Drug use: No     Allergies   Patient has no known allergies.   Review of Systems Review of Systems  Constitutional:  Negative for chills and fever.  HENT: Negative for sore throat.   Eyes: Negative for redness.  Respiratory: Negative for shortness of breath.   Cardiovascular: Positive for syncope. Negative for chest pain.  Gastrointestinal: Negative for blood in stool, diarrhea and vomiting.  Endocrine: Negative for polyuria.  Genitourinary: Negative for dysuria and flank pain.  Musculoskeletal: Negative for back pain and neck pain.  Skin: Negative for rash.  Neurological: Positive for syncope. Negative for speech difficulty, weakness, numbness and headaches.  Hematological: Does not bruise/bleed easily.  Psychiatric/Behavioral: Negative for confusion.     Physical Exam Updated Vital Signs BP 124/77   Pulse 66   Temp 99 F (37.2 C) (Oral)   Resp (!) 22   SpO2 100%   Physical Exam Vitals signs and nursing note reviewed.  Constitutional:      Appearance: Normal appearance. He is well-developed.  HENT:     Head: Atraumatic.     Comments: No facial or scalp sts or tenderness.     Nose: Nose normal.     Mouth/Throat:     Mouth: Mucous membranes are moist.     Pharynx: Oropharynx is clear.  Eyes:     General: No scleral icterus.    Conjunctiva/sclera: Conjunctivae normal.     Pupils: Pupils are equal, round, and reactive to light.  Neck:     Musculoskeletal: Normal range  of motion and neck supple. No neck rigidity or muscular tenderness.     Trachea: No tracheal deviation.  Cardiovascular:     Rate and Rhythm: Normal rate and regular rhythm.     Pulses: Normal pulses.     Heart sounds: Normal heart sounds. No murmur. No friction rub. No gallop.   Pulmonary:     Effort: Pulmonary effort is normal. No accessory muscle usage or respiratory distress.     Breath sounds: Normal breath sounds.  Chest:     Chest wall: No tenderness.  Abdominal:     General: Bowel sounds are normal. There is no distension.     Palpations: Abdomen is soft. There is no mass.     Tenderness: There is no  abdominal tenderness. There is no guarding or rebound.     Hernia: No hernia is present.  Genitourinary:    Comments: No cva tenderness. Musculoskeletal:        General: No swelling or tenderness.     Right lower leg: No edema.     Left lower leg: No edema.     Comments: CTLS spine, non tender, aligned, no step off.   Skin:    General: Skin is warm and dry.     Findings: No rash.  Neurological:     Mental Status: He is alert.     Comments: Alert, speech clear. Motor/sens grossly intact bil. Steady gait.   Psychiatric:        Mood and Affect: Mood normal.      ED Treatments / Results  Labs (all labs ordered are listed, but only abnormal results are displayed) Results for orders placed or performed during the hospital encounter of 05/13/18  Basic metabolic panel  Result Value Ref Range   Sodium 138 135 - 145 mmol/L   Potassium 3.9 3.5 - 5.1 mmol/L   Chloride 105 98 - 111 mmol/L   CO2 27 22 - 32 mmol/L   Glucose, Bld 97 70 - 99 mg/dL   BUN 9 6 - 20 mg/dL   Creatinine, Ser 2.48 0.61 - 1.24 mg/dL   Calcium 8.8 (L) 8.9 - 10.3 mg/dL   GFR calc non Af Amer >60 >60 mL/min   GFR calc Af Amer >60 >60 mL/min   Anion gap 6 5 - 15  CBC  Result Value Ref Range   WBC 12.9 (H) 4.0 - 10.5 K/uL   RBC 5.01 4.22 - 5.81 MIL/uL   Hemoglobin 15.4 13.0 - 17.0 g/dL   HCT 18.5 90.9 - 31.1 %   MCV 95.2 80.0 - 100.0 fL   MCH 30.7 26.0 - 34.0 pg   MCHC 32.3 30.0 - 36.0 g/dL   RDW 21.6 24.4 - 69.5 %   Platelets 244 150 - 400 K/uL   nRBC 0.0 0.0 - 0.2 %  Urinalysis, Routine w reflex microscopic  Result Value Ref Range   Color, Urine YELLOW YELLOW   APPearance CLEAR CLEAR   Specific Gravity, Urine 1.015 1.005 - 1.030   pH 6.0 5.0 - 8.0   Glucose, UA NEGATIVE NEGATIVE mg/dL   Hgb urine dipstick NEGATIVE NEGATIVE   Bilirubin Urine NEGATIVE NEGATIVE   Ketones, ur NEGATIVE NEGATIVE mg/dL   Protein, ur NEGATIVE NEGATIVE mg/dL   Nitrite NEGATIVE NEGATIVE   Leukocytes,Ua NEGATIVE NEGATIVE    CBG monitoring, ED  Result Value Ref Range   Glucose-Capillary 90 70 - 99 mg/dL   EKG EKG Interpretation  Date/Time:  Tuesday May 13 2018 07:19:54 EST Ventricular  Rate:  84 PR Interval:  142 QRS Duration: 84 QT Interval:  350 QTC Calculation: 413 R Axis:   70 Text Interpretation:  Normal sinus rhythm No previous tracing Confirmed by Cathren Laine ([id number]) on 05/13/2018 8:58:54 AM   Radiology No results found.  Procedures Procedures (including critical care time)  Medications Ordered in ED Medications  sodium chloride flush (NS) 0.9 % injection 3 mL (has no administration in time range)     Initial Impression / Assessment and Plan / ED Course  I have reviewed the triage vital signs and the nursing notes.  Pertinent labs & imaging results that were available during my care of the patient were reviewed by me and considered in my medical decision making (see chart for details).  Labs sent. Continuous cardiac monitoring. Ecg.   Reviewed nursing notes and prior charts for additional history.   Po fluids.  Labs reviewed - chem normal, hgb normal.  Patient remains in sinus rhythm. Pt denies any current symptoms. Pt does request work note for today.  Pt currently appears stable for d/c.     Final Clinical Impressions(s) / ED Diagnoses   Final diagnoses:  None    ED Discharge Orders    None           Cathren Laine, MD 05/13/18 [phone]

## 2018-05-13 NOTE — Discharge Instructions (Signed)
It was our pleasure to provide your ER care today - we hope that you feel better.  Rest. Drink plenty of fluids.  Follow up with primary care doctor in 1-2 weeks.   Return to ER if worse, new symptoms, new or severe pain, persistent vomiting, recurrent fainting, other concern.

## 2018-05-13 NOTE — ED Triage Notes (Signed)
Pt reports last night he became weak and diaphoretic, while walking to the kitchen he had syncope. Pt states he had BM shortly after and felt better. Pt aoX4, skin warm and dry.

## 2018-11-11 ENCOUNTER — Emergency Department (HOSPITAL_COMMUNITY)
Admission: EM | Admit: 2018-11-11 | Discharge: 2018-11-11 | Disposition: A | Discharge status: Home / Self Care | Payer: BC Managed Care – PPO | Attending: Emergency Medicine | Admitting: Emergency Medicine

## 2018-11-11 ENCOUNTER — Encounter (HOSPITAL_COMMUNITY): Payer: Self-pay | Admitting: Emergency Medicine

## 2018-11-11 ENCOUNTER — Other Ambulatory Visit: Payer: Self-pay

## 2018-11-11 DIAGNOSIS — Z202 Contact with and (suspected) exposure to infections with a predominantly sexual mode of transmission: Secondary | ICD-10-CM | POA: Diagnosis not present

## 2018-11-11 DIAGNOSIS — R369 Urethral discharge, unspecified: Secondary | ICD-10-CM | POA: Diagnosis not present

## 2018-11-11 DIAGNOSIS — F17210 Nicotine dependence, cigarettes, uncomplicated: Secondary | ICD-10-CM | POA: Diagnosis not present

## 2018-11-11 MED ORDER — AZITHROMYCIN 1 G PO PACK
1.0000 g | PACK | Freq: Once | ORAL | Status: AC
  Administered 2018-11-11: 1 g via ORAL
  Filled 2018-11-11: qty 1

## 2018-11-11 MED ORDER — CEFTRIAXONE SODIUM 250 MG IJ SOLR
250.0000 mg | Freq: Once | INTRAMUSCULAR | Status: AC
  Administered 2018-11-11: 18:00:00 250 mg via INTRAMUSCULAR
  Filled 2018-11-11: qty 250

## 2018-11-11 NOTE — ED Triage Notes (Signed)
Called and told he needed to get treated for [facility] and Chlamydia as partner was positive.
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
## 2018-11-11 NOTE — Discharge Instructions (Addendum)
You were evaluated in the Emergency Department and after careful evaluation, we did not find any emergent condition requiring admission or further testing in the hospital.  We have treated you for gonorrhea and chlamydia.  We will evaluate your urine sample for these infections and call you with abnormal results.  We advise you to inform any and all sexual partners of this medical management so that they to can be tested or treated.  Please return to the Emergency Department if you experience any worsening of your condition.  We encourage you to follow up with a primary care provider.  Thank you for allowing [place] to be a part of your care.

## 2018-11-13 LAB — GC/CHLAMYDIA PROBE AMP (~~LOC~~) NOT AT ARMC
Chlamydia: NEGATIVE
Neisseria Gonorrhea: NEGATIVE

## 2018-11-23 ENCOUNTER — Other Ambulatory Visit: Payer: Self-pay

## 2018-11-23 ENCOUNTER — Encounter (HOSPITAL_COMMUNITY): Payer: Self-pay

## 2018-11-23 ENCOUNTER — Ambulatory Visit (HOSPITAL_COMMUNITY)
Admission: EM | Admit: 2018-11-23 | Discharge: 2018-11-23 | Disposition: A | Discharge status: Home / Self Care | Payer: BC Managed Care – PPO

## 2018-11-23 DIAGNOSIS — Z0289 Encounter for other administrative examinations: Secondary | ICD-10-CM

## 2018-11-23 DIAGNOSIS — Z7689 Persons encountering health services in other specified circumstances: Secondary | ICD-10-CM
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
## 2019-05-07 ENCOUNTER — Emergency Department (HOSPITAL_COMMUNITY)
Admission: EM | Admit: 2019-05-07 | Discharge: 2019-05-07 | Disposition: A | Discharge status: Home / Self Care | Payer: BC Managed Care – PPO | Attending: Emergency Medicine | Admitting: Emergency Medicine

## 2019-05-07 ENCOUNTER — Encounter (HOSPITAL_COMMUNITY): Payer: Self-pay

## 2019-05-07 ENCOUNTER — Other Ambulatory Visit: Payer: Self-pay

## 2019-05-07 DIAGNOSIS — Z202 Contact with and (suspected) exposure to infections with a predominantly sexual mode of transmission: Secondary | ICD-10-CM | POA: Diagnosis present

## 2019-05-07 DIAGNOSIS — R369 Urethral discharge, unspecified: Secondary | ICD-10-CM

## 2019-05-07 DIAGNOSIS — F17210 Nicotine dependence, cigarettes, uncomplicated: Secondary | ICD-10-CM | POA: Insufficient documentation

## 2019-05-07 LAB — URINALYSIS, ROUTINE W REFLEX MICROSCOPIC
Bilirubin Urine: NEGATIVE
Glucose, UA: NEGATIVE mg/dL
Hgb urine dipstick: NEGATIVE
Ketones, ur: NEGATIVE mg/dL
Leukocytes,Ua: NEGATIVE
Nitrite: NEGATIVE
Protein, ur: NEGATIVE mg/dL
Specific Gravity, Urine: 1.025 (ref 1.005–1.030)
pH: 6 (ref 5.0–8.0)

## 2019-05-07 MED ORDER — STERILE WATER FOR INJECTION IJ SOLN
INTRAMUSCULAR | Status: AC
  Administered 2019-05-07: 1.2 mL
  Filled 2019-05-07: qty 10

## 2019-05-07 MED ORDER — CEFTRIAXONE SODIUM 1 G IJ SOLR
500.0000 mg | Freq: Once | INTRAMUSCULAR | Status: AC
  Administered 2019-05-07: 500 mg via INTRAMUSCULAR
  Filled 2019-05-07: qty 10

## 2019-05-07 MED ORDER — DOXYCYCLINE HYCLATE 100 MG PO CAPS: 100.0000 mg | ORAL_CAPSULE | Freq: Two times a day (BID) | ORAL | 0 refills | Status: DC
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
## 2019-05-07 NOTE — ED Triage Notes (Signed)
Pt reports that he was told last night that he was exposed to chlamydia and gonorrhea. He reports some yellow discharge, but denies pain or dysuria.

## 2019-05-07 NOTE — Discharge Instructions (Addendum)
You were tested today for gonorrhea and chlamydia.  You were treated today for gonorrhea and will need to take the prescribed antibiotic for the next week for treatment of chlamydia.  You will be called in the next 2 to 3 days if any results are positive, you can also review negative results online.  You should not be sexually active until you & your partner have completed treatment.  Please make sure you are using protection when sexually active to help prevent further STDs.  You can go to the health department for any future STD testing needs.

## 2019-10-15 ENCOUNTER — Emergency Department (HOSPITAL_COMMUNITY)
Admission: EM | Admit: 2019-10-15 | Discharge: 2019-10-15 | Disposition: A | Discharge status: Home / Self Care | Payer: 59 | Attending: Emergency Medicine | Admitting: Emergency Medicine

## 2019-10-15 ENCOUNTER — Other Ambulatory Visit: Payer: Self-pay

## 2019-10-15 ENCOUNTER — Encounter (HOSPITAL_COMMUNITY): Payer: Self-pay | Admitting: Emergency Medicine

## 2019-10-15 DIAGNOSIS — Z711 Person with feared health complaint in whom no diagnosis is made: Secondary | ICD-10-CM

## 2019-10-15 DIAGNOSIS — Z202 Contact with and (suspected) exposure to infections with a predominantly sexual mode of transmission: Secondary | ICD-10-CM | POA: Insufficient documentation

## 2019-10-15 DIAGNOSIS — F17210 Nicotine dependence, cigarettes, uncomplicated: Secondary | ICD-10-CM | POA: Diagnosis not present

## 2019-10-15 LAB — URINALYSIS, ROUTINE W REFLEX MICROSCOPIC
Bilirubin Urine: NEGATIVE
Glucose, UA: NEGATIVE mg/dL
Hgb urine dipstick: NEGATIVE
Ketones, ur: NEGATIVE mg/dL
Leukocytes,Ua: NEGATIVE
Nitrite: NEGATIVE
Protein, ur: NEGATIVE mg/dL
Specific Gravity, Urine: 1.017 (ref 1.005–1.030)
pH: 6 (ref 5.0–8.0)

## 2019-10-15 LAB — HIV ANTIBODY (ROUTINE TESTING W REFLEX): HIV Screen 4th Generation wRfx: NONREACTIVE

## 2019-10-15 MED ORDER — DOXYCYCLINE HYCLATE 100 MG PO CAPS: 100.0000 mg | ORAL_CAPSULE | Freq: Two times a day (BID) | ORAL | 0 refills | Status: AC

## 2019-10-15 MED ORDER — CEFTRIAXONE SODIUM 1 G IJ SOLR
500.0000 mg | Freq: Once | INTRAMUSCULAR | Status: AC
  Administered 2019-10-15: 500 mg via INTRAMUSCULAR
  Filled 2019-10-15: qty 10

## 2019-10-15 MED ORDER — DOXYCYCLINE HYCLATE 100 MG PO TABS
100.0000 mg | ORAL_TABLET | Freq: Once | ORAL | Status: AC
  Administered 2019-10-15: 100 mg via ORAL
  Filled 2019-10-15: qty 1

## 2019-10-15 NOTE — Discharge Instructions (Signed)
You have been treated today for an STD.   Take antibiotics as directed. Please take all of your antibiotics until finished.  The test results with take 2-3 days to return. If there is an abnormal result, you will be notified. If you do not hear anything, that means the results were negative. You can also log on MyChart to see the results.   Your sexual partner needs to be treated too. Do not have sexual intercourse for the next 7 days and after your partner has been treated.   Follow-up with your primary care doctor in 2-4 days. If you do not have a primary care doctor, you can use one listed in the paperwork.   Return to the Emergency Department for any fever, abdominal pain, difficulty breathing, nausea/vomiting or any other worsening or concerning symptoms.

## 2019-10-15 NOTE — ED Provider Notes (Signed)
[facility]-EMERGENCY DEPT Provider Note   CSN: [id number] Arrival date & time: 10/15/19  1634     History Chief Complaint  Patient presents with   Exposure to STD    Andrew Preston is a 34 y.o. male who presents for evaluation of known exposure to gonorrhea and chlamydia.  He reports that he had a sexual partner and was notified today that she tested positive for gonorrhea and chlamydia.  He came today for treatment.  He states he did notice some discharge today from his penis.  He has not had any dysuria, hematuria.  Denies any fevers, abdominal pain.  He is currently sexually active with 1 partner.  They do not use protection.  The history is provided by the patient.       History reviewed. No pertinent past medical history.  There are no problems to display for this patient.   History reviewed. No pertinent surgical history.     Family History  Problem Relation Age of Onset   Healthy Mother    Healthy Father     Social History   Tobacco Use   Smoking status: Current Every Day Smoker    Packs/day: 0.50    Types: Cigarettes   Smokeless tobacco: Never Used  Vaping Use   Vaping Use: Never used  Substance Use Topics   Alcohol use: Yes    Comment: Soical   Drug use: No    Home Medications Prior to Admission medications   Medication Sig Start Date End Date Taking? Authorizing Provider  doxycycline (VIBRAMYCIN) 100 MG capsule Take 1 capsule (100 mg total) by mouth 2 (two) times daily for 7 days. 10/15/19 10/22/19  Maxwell Caul, PA-C    Allergies    Patient has no known allergies.  Review of Systems   Review of Systems  Genitourinary: Positive for discharge. Negative for dysuria, hematuria, penile swelling, scrotal swelling and testicular pain.  All other systems reviewed and are negative.   Physical Exam Updated Vital Signs BP 132/82 (BP Location: Left Arm)    Pulse 66    Temp 98.1 F (36.7 C) (Oral)    Resp 18    Ht 5\' 10"   (1.778 m)    Wt 68 kg    SpO2 100%    BMI 21.52 kg/m   Physical Exam Vitals and nursing note reviewed. Exam conducted with a chaperone present.  Constitutional:      Appearance: He is well-developed.  HENT:     Head: Normocephalic and atraumatic.  Eyes:     General: No scleral icterus.       Right eye: No discharge.        Left eye: No discharge.     Conjunctiva/sclera: Conjunctivae normal.  Pulmonary:     Effort: Pulmonary effort is normal.  Genitourinary:    Penis: Normal and circumcised.      Testes:        Right: Tenderness or swelling not present.        Left: Tenderness or swelling not present.     Comments: The exam was performed with a chaperone present. Normal male genitalia. No evidence of rash, ulcers or lesions.  Skin:    General: Skin is warm and dry.  Neurological:     Mental Status: He is alert.  Psychiatric:        Speech: Speech normal.        Behavior: Behavior normal.     ED Results / Procedures / Treatments  Labs (all labs ordered are listed, but only abnormal results are displayed) Labs Reviewed  URINALYSIS, ROUTINE W REFLEX MICROSCOPIC  HIV ANTIBODY (ROUTINE TESTING W REFLEX)  RPR  GC/CHLAMYDIA PROBE AMP () NOT AT [facility]    EKG None  Radiology No results found.  Procedures Procedures (including critical care time)  Medications Ordered in ED Medications  cefTRIAXone (ROCEPHIN) injection 500 mg (has no administration in time range)  doxycycline (VIBRA-TABS) tablet 100 mg (has no administration in time range)    ED Course  I have reviewed the triage vital signs and the nursing notes.  Pertinent labs & imaging results that were available during my care of the patient were reviewed by me and considered in my medical decision making (see chart for details).    MDM Rules/Calculators/A&P                          34 y.o. M who presents for evaluation of known STD exposure. Patient is afebrile, non-toxic appearing, sitting  comfortably on examination table. Vital signs reviewed and stable. GU exam shows normal penis/testicles.  No tenderness to palpation of the testes or epididymis to suggest orchitis or epididymitis.  STD cultures obtained and pending. Discussed with patient treatment options including treatment today or waiting until cultures returned for treatment.  Patient wishes to have treatment today.   Discussed importance of using protection when sexually active. Pt understands that they have GC/Chlamydia cultures pending and that they will need to inform all sexual partners if results return positive. Patient has been treated prophylactically. Patient had ample opportunity for questions and discussion. All patient's questions were answered with full understanding. Strict return precautions discussed. Patient expresses understanding and agreement to plan.   Portions of this note were generated with Scientist, clinical (histocompatibility and immunogenetics). Dictation errors may occur despite best attempts at proofreading.   Final Clinical Impression(s) / ED Diagnoses Final diagnoses:  Concern about STD in male without diagnosis    Rx / DC Orders ED Discharge Orders         Ordered    doxycycline (VIBRAMYCIN) 100 MG capsule  2 times daily     Discontinue  Reprint     10/15/19 1910           Rosana Hoes 10/15/19 1913    Gwyneth Sprout, MD 10/15/19 2226

## 2019-10-15 NOTE — ED Triage Notes (Signed)
Patient requesting treatment for gonorrhea and chlamydia after made aware of positive partner. Reports penile discharge today.

## 2019-10-16 LAB — RPR: RPR Ser Ql: NONREACTIVE

## 2019-10-18 LAB — GC/CHLAMYDIA PROBE AMP (~~LOC~~) NOT AT ARMC
Chlamydia: NEGATIVE
Comment: NEGATIVE
Comment: NORMAL
Neisseria Gonorrhea: NEGATIVE

## 2019-12-15 ENCOUNTER — Inpatient Hospital Stay (INDEPENDENT_AMBULATORY_CARE_PROVIDER_SITE_OTHER): Payer: 59 | Admitting: Primary Care

## 2020-01-04 ENCOUNTER — Telehealth (INDEPENDENT_AMBULATORY_CARE_PROVIDER_SITE_OTHER): Payer: 59 | Admitting: Primary Care

## 2020-01-28 ENCOUNTER — Ambulatory Visit (HOSPITAL_COMMUNITY)
Admission: EM | Admit: 2020-01-28 | Discharge: 2020-01-28 | Disposition: A | Discharge status: Home / Self Care | Payer: 59 | Attending: Urgent Care | Admitting: Urgent Care

## 2020-01-28 ENCOUNTER — Encounter (HOSPITAL_COMMUNITY): Payer: Self-pay

## 2020-01-28 ENCOUNTER — Other Ambulatory Visit: Payer: Self-pay

## 2020-01-28 DIAGNOSIS — R369 Urethral discharge, unspecified: Secondary | ICD-10-CM | POA: Diagnosis present

## 2020-01-28 NOTE — ED Triage Notes (Signed)
Pt presents for STD testing after having penile discharge yesterday.

## 2020-01-28 NOTE — ED Provider Notes (Signed)
  [facility]   MRN: [id number] DOB: 15-Oct-1985  Subjective:   Andrew Preston is a 34 y.o. male presenting for 1 day hx of acute onset penile discharge. Patient had sex recently, condom broke. Denies dysuria, hematuria, urinary frequency, penile swelling, testicular pain, testicular swelling, anal pain, groin pain.   No current facility-administered medications for this encounter. No current outpatient medications on file.   No Known Allergies  History reviewed. No pertinent past medical history.   History reviewed. No pertinent surgical history.  Family History  Problem Relation Age of Onset  . Healthy Mother   . Healthy Father     Social History   Tobacco Use  . Smoking status: Current Every Day Smoker    Packs/day: 0.50    Types: Cigarettes  . Smokeless tobacco: Never Used  Vaping Use  . Vaping Use: Never used  Substance Use Topics  . Alcohol use: Yes    Comment: Soical  . Drug use: No    ROS   Objective:   Vitals: BP 135/86 (BP Location: Left Arm)   Pulse 73   Temp 98 F (36.7 C) (Oral)   Resp 18   SpO2 99%   Physical Exam Constitutional:      General: He is not in acute distress.    Appearance: Normal appearance. He is well-developed and normal weight. He is not ill-appearing, toxic-appearing or diaphoretic.  HENT:     Head: Normocephalic and atraumatic.     Right Ear: External ear normal.     Left Ear: External ear normal.     Nose: Nose normal.     Mouth/Throat:     Pharynx: Oropharynx is clear.  Eyes:     General: No scleral icterus.       Right eye: No discharge.        Left eye: No discharge.     Extraocular Movements: Extraocular movements intact.     Pupils: Pupils are equal, round, and reactive to light.  Cardiovascular:     Rate and Rhythm: Normal rate.  Pulmonary:     Effort: Pulmonary effort is normal.  Genitourinary:    Penis: Circumcised. No phimosis, paraphimosis, hypospadias, erythema, tenderness,  discharge, swelling or lesions.   Musculoskeletal:     Cervical back: Normal range of motion.  Neurological:     Mental Status: He is alert and oriented to person, place, and time.  Psychiatric:        Mood and Affect: Mood normal.        Behavior: Behavior normal.        Thought Content: Thought content normal.        Judgment: Judgment normal.      Assessment and Plan :   PDMP not reviewed this encounter.  1. Penile discharge     No appreciable penile discharge on exam. Will treat based off of lab results. Encouraged continued safe sex practices.    Wallis Bamberg, PA-C 01/28/20 1821

## 2020-01-29 ENCOUNTER — Telehealth (HOSPITAL_COMMUNITY): Payer: Self-pay | Admitting: Emergency Medicine

## 2020-01-29 LAB — CYTOLOGY, (ORAL, ANAL, URETHRAL) ANCILLARY ONLY
Chlamydia: POSITIVE — AB
Comment: NEGATIVE
Comment: NEGATIVE
Comment: NORMAL
Neisseria Gonorrhea: NEGATIVE
Trichomonas: NEGATIVE

## 2020-01-29 MED ORDER — DOXYCYCLINE HYCLATE 100 MG PO CAPS: 100.0000 mg | ORAL_CAPSULE | Freq: Two times a day (BID) | ORAL | 0 refills | Status: AC

## 2020-04-01 ENCOUNTER — Telehealth: Payer: 59 | Admitting: Physician Assistant

## 2020-04-01 DIAGNOSIS — J069 Acute upper respiratory infection, unspecified: Secondary | ICD-10-CM

## 2020-04-01 DIAGNOSIS — R059 Cough, unspecified: Secondary | ICD-10-CM | POA: Diagnosis not present

## 2020-04-01 MED ORDER — BENZONATATE 100 MG PO CAPS: 100.0000 mg | ORAL_CAPSULE | Freq: Three times a day (TID) | ORAL | 0 refills | Status: DC | PRN

## 2020-04-01 MED ORDER — AZELASTINE HCL 0.1 % NA SOLN: 1.0000 | Freq: Two times a day (BID) | NASAL | 0 refills | Status: AC

## 2020-04-01 NOTE — Progress Notes (Signed)
We are sorry you are not feeling well.  Here is how we plan to help!  Based on what you have shared with me, it looks like you may have a viral upper respiratory infection.  Upper respiratory infections are caused by a large number of viruses; however, rhinovirus is the most common cause.   Symptoms vary from person to person, with common symptoms including sore throat, cough, fatigue or lack of energy and feeling of general discomfort.  A low-grade fever of up to 100.4 may present, but is often uncommon.  Symptoms vary however, and are closely related to a person's age or underlying illnesses.  The most common symptoms associated with an upper respiratory infection are nasal discharge or congestion, cough, sneezing, headache and pressure in the ears and face.  These symptoms usually persist for about 3 to 10 days, but can last up to 2 weeks.  It is important to know that upper respiratory infections do not cause serious illness or complications in most cases.    Upper respiratory infections can be transmitted from person to person, with the most common method of transmission being a person's hands.  The virus is able to live on the skin and can infect other persons for up to 2 hours after direct contact.  Also, these can be transmitted when someone coughs or sneezes; thus, it is important to cover the mouth to reduce this risk.  To keep the spread of the illness at bay, good hand hygiene is very important.  This is an infection that is most likely caused by a virus. There are no specific treatments other than to help you with the symptoms until the infection runs its course.  We are sorry you are not feeling well.  Here is how we plan to help!   For nasal congestion, you may use an oral decongestants such as Mucinex D or if you have glaucoma or high blood pressure use plain Mucinex.  Saline nasal spray or nasal drops can help and can safely be used as often as needed for congestion.  For your congestion,  I have prescribed Azelastine nasal spray two sprays in each nostril twice a day  If you do not have a history of heart disease, hypertension, diabetes or thyroid disease, prostate/bladder issues or glaucoma, you may also use Sudafed to treat nasal congestion.  It is highly recommended that you consult with a pharmacist or your primary care physician to ensure this medication is safe for you to take.     If you have a cough, you may use cough suppressants such as Delsym and Robitussin.  If you have glaucoma or high blood pressure, you can also use Coricidin HBP.   For cough I have prescribed for you A prescription cough medication called Tessalon Perles 100 mg. You may take 1-2 capsules every 8 hours as needed for cough  If you have a sore or scratchy throat, use a saltwater gargle-  to  teaspoon of salt dissolved in a 4-ounce to 8-ounce glass of warm water.  Gargle the solution for approximately 15-30 seconds and then spit.  It is important not to swallow the solution.  You can also use throat lozenges/cough drops and Chloraseptic spray to help with throat pain or discomfort.  Warm or cold liquids can also be helpful in relieving throat pain.  For headache, pain or general discomfort, you can use Ibuprofen or Tylenol as directed.   Some authorities believe that zinc sprays or the use of   Echinacea may shorten the course of your symptoms.   HOME CARE . Only take medications as instructed by your medical team. . Be sure to drink plenty of fluids. Water is fine as well as fruit juices, sodas and electrolyte beverages. You may want to stay away from caffeine or alcohol. If you are nauseated, try taking small sips of liquids. How do you know if you are getting enough fluid? Your urine should be a pale yellow or almost colorless. . Get rest. . Taking a steamy shower or using a humidifier may help nasal congestion and ease sore throat pain. You can place a towel over your head and breathe in the steam  from hot water coming from a faucet. . Using a saline nasal spray works much the same way. . Cough drops, hard candies and sore throat lozenges may ease your cough. . Avoid close contacts especially the very young and the elderly . Cover your mouth if you cough or sneeze . Always remember to wash your hands.   GET HELP RIGHT AWAY IF: . You develop worsening fever. . If your symptoms do not improve within 10 days . You develop yellow or green discharge from your nose over 3 days. . You have coughing fits . You develop a severe head ache or visual changes. . You develop shortness of breath, difficulty breathing or start having chest pain . Your symptoms persist after you have completed your treatment plan  MAKE SURE YOU   Understand these instructions.  Will watch your condition.  Will get help right away if you are not doing well or get worse.  Your e-visit answers were reviewed by a board certified advanced clinical practitioner to complete your personal care plan. Depending upon the condition, your plan could have included both over the counter or prescription medications. Please review your pharmacy choice. If there is a problem, you may call our nursing hot line at and have the prescription routed to another pharmacy. Your safety is important to us. If you have drug allergies check your prescription carefully.   You can use MyChart to ask questions about today's visit, request a non-urgent call back, or ask for a work or school excuse for 24 hours related to this e-Visit. If it has been greater than 24 hours you will need to follow up with your provider, or enter a new e-Visit to address those concerns. You will get an e-mail in the next two days asking about your experience.  I hope that your e-visit has been valuable and will speed your recovery. Thank you for using e-visits.     Greater than 5 minutes, yet less than 10 minutes of time have been spent researching, coordinating  and implementing care for this patient today.   

## 2020-06-30 ENCOUNTER — Encounter: Payer: 59 | Admitting: Student

## 2020-08-03 ENCOUNTER — Encounter: Payer: Self-pay | Admitting: Student

## 2020-08-03 ENCOUNTER — Other Ambulatory Visit (HOSPITAL_COMMUNITY)
Admission: RE | Admit: 2020-08-03 | Discharge: 2020-08-03 | Disposition: A | Discharge status: Home / Self Care | Payer: 59 | Source: Ambulatory Visit | Attending: Internal Medicine | Admitting: Internal Medicine

## 2020-08-03 ENCOUNTER — Ambulatory Visit (INDEPENDENT_AMBULATORY_CARE_PROVIDER_SITE_OTHER): Payer: 59 | Admitting: Student

## 2020-08-03 ENCOUNTER — Other Ambulatory Visit: Payer: Self-pay

## 2020-08-03 VITALS — BP 121/77 | HR 69 | Temp 97.7°F | Ht 70.0 in | Wt 162.2 lb

## 2020-08-03 DIAGNOSIS — Z789 Other specified health status: Secondary | ICD-10-CM

## 2020-08-03 DIAGNOSIS — Z7251 High risk heterosexual behavior: Secondary | ICD-10-CM | POA: Insufficient documentation

## 2020-08-03 DIAGNOSIS — R002 Palpitations: Secondary | ICD-10-CM | POA: Diagnosis not present

## 2020-08-03 NOTE — Assessment & Plan Note (Signed)
Assessment: Patient requested information about taking supplements and how this can affect him.  He is taking testosterone booster, C4 preworkout, as well as many other protein supplements.  Discussed with him that these may be worsening his palpitations and recommended that he decrease taking these.  We discussed that a lot of these dietary and workout supplements are not FDA approved and therefore we are uncertain as to how safe they are and it limits what we are able to recommend.  Discussed with him to avoid buying testosterone weekly as well as encouraged him to only take supplements that are FDA approved.  Plan: -Decrease amount of workout supplements to see if that helps with palpitations

## 2020-08-03 NOTE — Progress Notes (Signed)
   CC: Palpitations, hypersexual behavior, use of exercise supple  HPI:  [name] is a 35 y.o. male with a past medical history stated below and presents today for palpitations, tachycardia. Please see problem based assessment and plan for additional details.  No past medical history on file.  Current Outpatient Medications on File Prior to Visit  Medication Sig Dispense Refill  . azelastine (ASTELIN) 0.1 % nasal spray Place 1-2 sprays into both nostrils 2 (two) times daily. Use in each nostril as directed 30 mL 0  . benzonatate (TESSALON) 100 MG capsule Take 1-2 capsules (100-200 mg total) by mouth 3 (three) times daily as needed for cough. 40 capsule 0   No current facility-administered medications on file prior to visit.    Family History  Problem Relation Age of Onset  . Healthy Mother   . Healthy Father     Social History   Socioeconomic History  . Marital status: Single    Spouse name: Not on file  . Number of children: Not on file  . Years of education: Not on file  . Highest education level: Not on file  Occupational History  . Not on file  Tobacco Use  . Smoking status: Current Every Day Smoker    Packs/day: 0.50    Types: Cigarettes  . Smokeless tobacco: Never Used  Vaping Use  . Vaping Use: Never used  Substance and Sexual Activity  . Alcohol use: Yes    Comment: Soical  . Drug use: No  . Sexual activity: Yes  Other Topics Concern  . Not on file  Social History Narrative  . Not on file   Social Determinants of Health   Financial Resource Strain: Not on file  Food Insecurity: Not on file  Transportation Needs: Not on file  Physical Activity: Not on file  Stress: Not on file  Social Connections: Not on file  Intimate Partner Violence: Not on file    Review of Systems: ROS negative except for what is noted on the assessment and plan.  Vitals:   08/03/20 1518  BP: 121/77  Pulse: 69  Temp: 97.7 F (36.5 C)  TempSrc: Oral  SpO2:  100%  Weight: 162 lb 3.2 oz (73.6 kg)  Height: 5\' 10"  (1.778 m)     Physical Exam: Constitutional: well-appearing, no acute distress HENT: normocephalic atraumatic Eyes: conjunctiva non-erythematous Neck: supple Cardiovascular: regular rate and rhythm, no m/r/g Pulmonary/Chest: normal work of breathing on room air, lungs clear to auscultation bilaterally MSK: normal bulk and tone Neurological: alert & oriented x 3, 5/5 strength in bilateral upper and lower extremities, normal gait Skin: warm and dry Psych: Normal mood and thought process   Assessment & Plan:   See Encounters Tab for problem based charting.  Patient discussed with Dr. , D.O. [facility], PGY-1 Pager: [phone], Phone: [phone] Date 08/03/2020 Time 7:28 PM

## 2020-08-03 NOTE — Assessment & Plan Note (Addendum)
Assessment: Patient requesting STI testing.  Patient is currently asymptomatic.  Patient endorses 5-6 different partners of the last month or so.  He notes intermittent use of condoms or other versions of protection.  States that his sexual encounters have been with male partners.  Discussed with him that if he is STI positive, he will need to contact his sexual partners.  Patient acknowledges understanding of this.  Discussed the importance of safe sex practices such as condom use between partners as well as STI testing in between any different sexual partners.  Patient acknowledges understanding this.  Discussed with him that we are available for STI testing in between partners as well as gave patient a list of free STI centers in [facility].  Plan: -Urine gonorrhea chlamydia, RPR, HIV testing -Discussed importance of safe sex practices

## 2020-08-03 NOTE — Assessment & Plan Note (Signed)
Assessment: Patient endorses frequent episodes of palpitations as well as feelings of his heart racing.  States that it was pointed out to him by a male after intercourse and since then he has noticed it more frequently.  States that most recently he was sitting on the couch relaxing any notes that his heart was racing.  He was able to take some deep breaths and this helped resolve the symptoms.  He denies having the symptoms while working out.  The patient is quite active and exercises 5-6 times at week including running 3 miles and then weightlifting.  He does take dietary supplements including preworkout.  He denies episodes of excessive sweating or weight loss.  He actually states that he has had weight gain.  He denies any chest pain or shortness of breath with these episodes.  Denies episodes of passing out or losing consciousness.  Denies any episodes of dizziness.  Overall do not believe that these need further work-up at this time.  Possibly may be PVCs.  He is young, healthy, has no family history of significant heart disease at a young age.  Discussed with him that if he is having episodes of excessive sweating or weight loss, we can consider a TSH study.  Also discussed with him that if he feels as though his heart is out of rhythm if he has any syncopal episodes or episodes of passing out we can do an EKG and further cardiac work-up.  Can also consider beta-blocker however patient has remained in without tachycardia during any of his hospital visits or epic records.  Plan: -Continue to monitor -If continued symptoms consider cardiac/endocrine work-up

## 2020-08-03 NOTE — Patient Instructions (Signed)
Thank you, Mr.Sevastian L Lukic for allowing [place] to provide your care today. Today we discussed  Racing heart rate Your history as well as physical exam is reassuring.  In young healthy adults this can happen.  If you notice that you are having episodes of chest pain or shortness of breath please call our office.  If you like you are having a medical emergency please go to your closest emergency room.  If they persist, please follow-up in our clinic and we will perform an EKG.  If you notice that you are having episodes of excessive sweating or weight loss please give our clinic a call.  High risk sexual behavior We we tested you for gonorrhea chlamydia, HIV, syphilis.  If any of these test come back positive I will give you a call.  Please continue to use safe sex measures such as condom use.  Please get STI tested in between sexual partners if any of these testings to come back positive I recommend contacting your partners and having them treated as well.  I have given you a list of free HIV and STI testing centers.  Please use these in between partners and you are also welcome to come back to our clinic to have these test ran.  Workout supplement use Please try to decrease workout supplements.  Please only try to use things that are FDA approved.  Please stay hydrated if taking creatine  I have ordered the following labs for you:   Lab Orders     RPR     HIV antibody (with reflex)    Referrals ordered today:   Referral Orders  No referral(s) requested today     I have ordered the following medication/changed the following medications:   Stop the following medications: There are no discontinued medications.   Start the following medications: No orders of the defined types were placed in this encounter.    Follow up: For these symptoms as needed  Should you have any questions or concerns please call the internal medicine clinic at [phone].     Thalia Bloodgood, D.O. [facility]

## 2020-08-04 ENCOUNTER — Encounter: Payer: Self-pay | Admitting: Student

## 2020-08-04 LAB — URINE CYTOLOGY ANCILLARY ONLY
Chlamydia: NEGATIVE
Comment: NEGATIVE
Comment: NORMAL
Neisseria Gonorrhea: NEGATIVE

## 2020-08-04 LAB — HIV ANTIBODY (ROUTINE TESTING W REFLEX): HIV Screen 4th Generation wRfx: NONREACTIVE

## 2020-08-04 LAB — RPR: RPR Ser Ql: NONREACTIVE

## 2020-08-05 NOTE — Progress Notes (Signed)
Internal Medicine Clinic Attending  Case discussed with Dr. Katsadouros  At the time of the visit.  We reviewed the resident's history and exam and pertinent patient test results.  I agree with the assessment, diagnosis, and plan of care documented in the resident's note.  

## 2020-09-06 ENCOUNTER — Telehealth: Payer: Self-pay | Admitting: *Deleted

## 2020-09-06 NOTE — Telephone Encounter (Signed)
Called pt - telehealth appt scheduled tomorrow @ [phone] Am w/ Dr Karilyn Cota to discuss symptoms.

## 2020-09-07 ENCOUNTER — Ambulatory Visit (INDEPENDENT_AMBULATORY_CARE_PROVIDER_SITE_OTHER): Payer: 59 | Admitting: Internal Medicine

## 2020-09-07 DIAGNOSIS — J069 Acute upper respiratory infection, unspecified: Secondary | ICD-10-CM | POA: Diagnosis not present

## 2020-09-07 DIAGNOSIS — J029 Acute pharyngitis, unspecified: Secondary | ICD-10-CM

## 2020-09-07 DIAGNOSIS — R059 Cough, unspecified: Secondary | ICD-10-CM

## 2020-09-07 MED ORDER — BENZONATATE 100 MG PO CAPS: 100.0000 mg | ORAL_CAPSULE | Freq: Three times a day (TID) | ORAL | 0 refills | Status: AC | PRN

## 2020-09-07 NOTE — Progress Notes (Signed)
  [facility] Telephone Encounter Continuity Care Appointment  HPI:  This telephone encounter was created for Mr. Andrew Preston on 09/07/2020 for the following purpose/cc sore throat.   Past Medical History:  No past medical history on file.   ROS:  Review of Systems  Constitutional:  Positive for fever and malaise/fatigue.  HENT:  Positive for congestion, sinus pain and sore throat.   Respiratory:  Positive for cough and sputum production. Negative for shortness of breath.   Musculoskeletal:  Positive for myalgias.  Neurological:  Negative for headaches.     Assessment / Plan / Recommendations:  Please see A&P under problem oriented charting for assessment of the patient's acute and chronic medical conditions.  As always, pt is advised that if symptoms worsen or new symptoms arise, they should go to an urgent care facility or to to ER for further evaluation.   Consent and Medical Decision Making:  Patient discussed with Dr.  Mayford Knife This is a telephone encounter between Andrew Preston and Marise Knapper N [place]ares [place]thun on 09/07/2020 for sore throat. The visit was conducted with the patient located at home and Andrew Preston at [facility]. The patient's identity was confirmed using their DOB and current address. The patient has consented to being evaluated through a telephone encounter and understands the associated risks (an examination cannot be done and the patient may need to come in for an appointment) / benefits (allows the patient to remain at home, decreasing exposure to coronavirus). I personally spent 9 minutes on medical discussion.

## 2020-09-07 NOTE — Assessment & Plan Note (Addendum)
Patient states for the past 2 weeks he has been having fatigue, myalgias, severe sore throat, congestion in his chest and a productive cough with yellow sputum.  He states initially he was having fevers and chills as well however those have resolved.  Denies any headaches, sinus pain, shortness of breath, nausea or vomiting. He took 2 COVID tests, 1 of which was a PCR test at a local pharmacy and both were negative.  He denies any recent sick contacts.  He has tried many over-the-counter remedies including Mucinex, NyQuil and nasal sprays.  States that none of this has resolved any of his symptoms.  Discussed that I would like to evaluate him in person and do a strep and COVID test.  Patient expresses understanding and states that he would like an appointment for tomorrow morning.

## 2020-09-13 ENCOUNTER — Encounter: Payer: Self-pay | Admitting: *Deleted

## 2020-11-30 ENCOUNTER — Encounter (HOSPITAL_COMMUNITY): Payer: Self-pay

## 2020-11-30 ENCOUNTER — Other Ambulatory Visit: Payer: Self-pay

## 2020-11-30 ENCOUNTER — Emergency Department (HOSPITAL_COMMUNITY)
Admission: EM | Admit: 2020-11-30 | Discharge: 2020-11-30 | Disposition: A | Discharge status: Home / Self Care | Payer: 59 | Attending: Emergency Medicine | Admitting: Emergency Medicine

## 2020-11-30 DIAGNOSIS — Z202 Contact with and (suspected) exposure to infections with a predominantly sexual mode of transmission: Secondary | ICD-10-CM | POA: Diagnosis not present

## 2020-11-30 DIAGNOSIS — F17210 Nicotine dependence, cigarettes, uncomplicated: Secondary | ICD-10-CM | POA: Insufficient documentation

## 2020-11-30 DIAGNOSIS — R369 Urethral discharge, unspecified: Secondary | ICD-10-CM | POA: Diagnosis not present

## 2020-11-30 LAB — URINALYSIS, ROUTINE W REFLEX MICROSCOPIC
Bilirubin Urine: NEGATIVE
Glucose, UA: NEGATIVE mg/dL
Hgb urine dipstick: NEGATIVE
Ketones, ur: NEGATIVE mg/dL
Leukocytes,Ua: NEGATIVE
Nitrite: NEGATIVE
Protein, ur: NEGATIVE mg/dL
Specific Gravity, Urine: 1.02 (ref 1.005–1.030)
pH: 6 (ref 5.0–8.0)

## 2020-11-30 MED ORDER — LIDOCAINE HCL 1 % IJ SOLN
INTRAMUSCULAR | Status: AC
  Administered 2020-11-30: 2.1 mL
  Filled 2020-11-30: qty 20

## 2020-11-30 MED ORDER — CEFTRIAXONE SODIUM 1 G IJ SOLR
500.0000 mg | Freq: Once | INTRAMUSCULAR | Status: AC
  Administered 2020-11-30: 500 mg via INTRAMUSCULAR
  Filled 2020-11-30: qty 10

## 2020-11-30 MED ORDER — DOXYCYCLINE HYCLATE 100 MG PO CAPS: 100.0000 mg | ORAL_CAPSULE | Freq: Two times a day (BID) | ORAL | 0 refills | Status: AC

## 2020-11-30 MED ORDER — DOXYCYCLINE HYCLATE 100 MG PO TABS
100.0000 mg | ORAL_TABLET | Freq: Once | ORAL | Status: AC
  Administered 2020-11-30: 100 mg via ORAL
  Filled 2020-11-30: qty 1

## 2020-11-30 NOTE — ED Provider Notes (Signed)
[facility]-EMERGENCY DEPT Provider Note   CSN: [id number] Arrival date & time: 11/30/20  0505     History Chief Complaint  Patient presents with   SEXUALLY TRANSMITTED DISEASE    Andrew Preston is a 35 y.o. male presented emerged part with complaint of penile discharge and concern for STI exposure.  The patient reports he had unprotected sex with male partner, Considine last night and told him that she was positive for an STI.  She was unable to confirm which STI she was positive for, but she was treated.  He came to the ED for treatment.  Does report he has had some painful urination and some white-gray penile discharge for 2-3 days.  He denies any history of male sexual partners.  He reports he had tested negative for HIV and all other STIs recently had a PCP appointment.  He only wants treatment for GC chlamydia.  He denies fevers or chills  HPI     History reviewed. No pertinent past medical history.  Patient Active Problem List   Diagnosis Date Noted   Upper respiratory infection 09/07/2020   High risk sexual behavior 08/03/2020   Palpitations 08/03/2020   Takes dietary supplements 08/03/2020    History reviewed. No pertinent surgical history.     Family History  Problem Relation Age of Onset   Healthy Mother    Healthy Father     Social History   Tobacco Use   Smoking status: Every Day    Packs/day: 0.50    Types: Cigarettes   Smokeless tobacco: Never  Vaping Use   Vaping Use: Never used  Substance Use Topics   Alcohol use: Yes    Comment: Soical   Drug use: No    Home Medications Prior to Admission medications   Medication Sig Start Date End Date Taking? Authorizing Provider  doxycycline (VIBRAMYCIN) 100 MG capsule Take 1 capsule (100 mg total) by mouth 2 (two) times daily for 7 days. 11/30/20 12/07/20 Yes Nerida Boivin, Kermit Balo, MD  azelastine (ASTELIN) 0.1 % nasal spray Place 1-2 sprays into both nostrils 2 (two) times daily. Use in  each nostril as directed 04/01/20   McVey, Madelaine Bhat, PA-C  benzonatate (TESSALON) 100 MG capsule Take 1-2 capsules (100-200 mg total) by mouth 3 (three) times daily as needed for cough. 09/07/20   Rehman, Areeg N, DO    Allergies    Patient has no known allergies.  Review of Systems   Review of Systems  Constitutional:  Negative for chills and fever.  Genitourinary:  Positive for dysuria and penile discharge.  Musculoskeletal:  Negative for arthralgias and back pain.  Skin:  Negative for color change and rash.   Physical Exam Updated Vital Signs BP (!) 140/92 (BP Location: Right Arm)   Pulse 60   Temp 97.9 F (36.6 C) (Oral)   Resp 13   SpO2 100%   Physical Exam Constitutional:      General: He is not in acute distress. HENT:     Head: Normocephalic and atraumatic.  Eyes:     Conjunctiva/sclera: Conjunctivae normal.     Pupils: Pupils are equal, round, and reactive to light.  Cardiovascular:     Rate and Rhythm: Normal rate and regular rhythm.  Skin:    General: Skin is warm and dry.  Neurological:     Mental Status: He is alert.  Psychiatric:        Mood and Affect: Mood normal.  Behavior: Behavior normal.    ED Results / Procedures / Treatments   Labs (all labs ordered are listed, but only abnormal results are displayed) Labs Reviewed  URINALYSIS, ROUTINE W REFLEX MICROSCOPIC  GC/CHLAMYDIA PROBE AMP ([place]) NOT AT [facility]    EKG None  Radiology No results found.  Procedures Procedures   Medications Ordered in ED Medications  cefTRIAXone (ROCEPHIN) injection 500 mg (has no administration in time range)  doxycycline (VIBRA-TABS) tablet 100 mg (has no administration in time range)    ED Course  I have reviewed the triage vital signs and the nursing notes.  Pertinent labs & imaging results that were available during my care of the patient were reviewed by me and considered in my medical decision making (see chart for details).  This  patient presents to the Emergency Department with complaint of exposure or symptoms of sexually transmitted.    Based on the patient's clinical exam, vital signs, risk factors, and ED testing, I felt that the patient's overall risk of life-threatening emergency such as sepsis or torsion was low.  Based on his history felt was reasonable treatment empirically for chlamydia and gonorrhea.  I discussed outpatient follow up with primary care provider, and advised follow up on their culture results, if sent.  I also advised sexual abstinence while awaiting test results, and encouraging all prior and current sexual partners to obtain testing and treatment if positive for STI on today's workup.   Return precautions were discussed with the patient.  I felt the patient was clinically stable for discharge.   Final Clinical Impression(s) / ED Diagnoses Final diagnoses:  Penile discharge  Exposure to STD    Rx / DC Orders ED Discharge Orders          Ordered    doxycycline (VIBRAMYCIN) 100 MG capsule  2 times daily        11/30/20 8299             Terald Sleeper, MD 11/30/20 [phone]

## 2020-11-30 NOTE — Discharge Instructions (Addendum)
Please take a few minutes to read these important discharge instructions below:  You have been diagnosed with and treated for a presumed sexually transmitted infection such as chlamydia, gonorrhea, or trichomoniasis.  Chlamydia and gonorrhea are the most commonly reported communicable diseases in the [place]. They are especially common among people younger than 37 years of age.  Infection without symptoms is common in men and women. Your partner may be infected but not display symptoms.  Complications of these infections without treatment include serious pelvic infections, ectopic pregnancy, and infertility. Other infections such as HIV or HPV, which can occur in patients with sexually transmitted infections, can lead to cancer or death.  To minimize the spread of the infection, you should avoid sexual intercourse for 14 days.  To minimize the spread of the infection, you should advise your sexual partner(s) to be evaluated, tested and treated. This includes all sexual partners within the past 60 days or your last sexual partner if last contact was greater than 60 days.  To minimize the risk of reinfection, you should abstain from sexual intercourse until your sexual partners have been tested and treated.  Consistent condom use is important in preventing the spread of sexually transmitted infections.  Do you need to get re-tested? Studies reveal that infection with chlamydia or gonorrhea is common among those treated within the preceding several months. Testing for gonorrhea and chlamydia in approximately 3 months is recommended even if you believe your sexual partner has been treated. Based on your history or demographics, your doctor may feel that you are higher risk for HIV and syphilis.  Talk to your doctor about whether you should be tested for these diseases at this time.  When should you come back to the Emergency Department? If your symptoms have not improved after 48 hours from  when you received your antibiotics, you should contact your primary care doctor or return to the Emergency Department.  These may be signs of an infection that has not been fully treated.  If you develop worsening pain, worsening discharge from your penis or vagina, fevers of greater than 100.4 F, or have any other new or alarming symptoms, you should return promptly to the Emergency Department.  You should follow up with your primary care doctor's office in 1 week to ensure that your symptoms have resolved.

## 2020-11-30 NOTE — ED Triage Notes (Signed)
Pt states that a male friend told him she tested positive for an STD (unknown). Pt reports that it burns when he urinates x 1 day. Pt also reports some white discharge from his penis.

## 2021-09-14 ENCOUNTER — Other Ambulatory Visit: Payer: Self-pay

## 2021-09-14 ENCOUNTER — Emergency Department (HOSPITAL_COMMUNITY)
Admission: EM | Admit: 2021-09-14 | Discharge: 2021-09-14 | Disposition: A | Discharge status: Home / Self Care | Payer: 59 | Attending: Emergency Medicine | Admitting: Emergency Medicine

## 2021-09-14 ENCOUNTER — Encounter (HOSPITAL_COMMUNITY): Payer: Self-pay | Admitting: Emergency Medicine

## 2021-09-14 DIAGNOSIS — R369 Urethral discharge, unspecified: Secondary | ICD-10-CM | POA: Insufficient documentation

## 2021-09-14 LAB — URINALYSIS, ROUTINE W REFLEX MICROSCOPIC
Bacteria, UA: NONE SEEN
Bilirubin Urine: NEGATIVE
Glucose, UA: NEGATIVE mg/dL
Hgb urine dipstick: NEGATIVE
Ketones, ur: NEGATIVE mg/dL
Nitrite: NEGATIVE
Protein, ur: NEGATIVE mg/dL
Specific Gravity, Urine: 1.021 (ref 1.005–1.030)
WBC, UA: >50 WBC/hpf — ABNORMAL HIGH (ref 0–5)
pH: 5 (ref 5.0–8.0)

## 2021-09-14 MED ORDER — DOXYCYCLINE HYCLATE 100 MG PO TABS
100.0000 mg | ORAL_TABLET | Freq: Once | ORAL | Status: AC
  Administered 2021-09-14: 100 mg via ORAL
  Filled 2021-09-14: qty 1

## 2021-09-14 MED ORDER — DOXYCYCLINE HYCLATE 100 MG PO CAPS: 100.0000 mg | ORAL_CAPSULE | Freq: Two times a day (BID) | ORAL | 0 refills | Status: AC

## 2021-09-14 MED ORDER — CEFTRIAXONE SODIUM 1 G IJ SOLR
500.0000 mg | Freq: Once | INTRAMUSCULAR | Status: AC
  Administered 2021-09-14: 500 mg via INTRAMUSCULAR
  Filled 2021-09-14: qty 10

## 2021-09-14 NOTE — ED Provider Notes (Signed)
[facility]-EMERGENCY DEPT Provider Note   CSN: [id number] Arrival date & time: 09/14/21  2045     History  Chief Complaint  Patient presents with   Penile Discharge    Andrew Preston is a 36 y.o. male.   Penile Discharge    Patient presents with penile discharge x1 day.  He had sexual intercourse nonprotected last weekend.  No fevers, scrotal pain, abdominal pain, nausea, vomiting.  Home Medications Prior to Admission medications   Medication Sig Start Date End Date Taking? Authorizing Provider  doxycycline (VIBRAMYCIN) 100 MG capsule Take 1 capsule (100 mg total) by mouth 2 (two) times daily. 09/14/21  Yes Theron Arista, PA-C  azelastine (ASTELIN) 0.1 % nasal spray Place 1-2 sprays into both nostrils 2 (two) times daily. Use in each nostril as directed 04/01/20   McVey, Madelaine Bhat, PA-C  benzonatate (TESSALON) 100 MG capsule Take 1-2 capsules (100-200 mg total) by mouth 3 (three) times daily as needed for cough. 09/07/20   Rehman, Areeg N, DO      Allergies    Patient has no known allergies.    Review of Systems   Review of Systems  Genitourinary:  Positive for penile discharge.    Physical Exam Updated Vital Signs BP (!) 146/90 (BP Location: Right Arm)   Pulse 78   Temp 98.4 F (36.9 C) (Oral)   Resp 16   Ht 5\' 10"  (1.778 m)   Wt 72.6 kg   SpO2 94%   BMI 22.96 kg/m  Physical Exam Vitals and nursing note reviewed. Exam conducted with a chaperone present.  Constitutional:      General: He is not in acute distress.    Appearance: Normal appearance.  HENT:     Head: Normocephalic and atraumatic.  Eyes:     General: No scleral icterus.    Extraocular Movements: Extraocular movements intact.     Pupils: Pupils are equal, round, and reactive to light.  Genitourinary:    Comments: GU exam performed chaperone in room.  There is no urethritis, I do not appreciate any penile discharge.  No scrotal tenderness Skin:    Coloration: Skin is  not jaundiced.  Neurological:     Mental Status: He is alert. Mental status is at baseline.     Coordination: Coordination normal.     ED Results / Procedures / Treatments   Labs (all labs ordered are listed, but only abnormal results are displayed) Labs Reviewed  URINALYSIS, ROUTINE W REFLEX MICROSCOPIC - Abnormal; Notable for the following components:      Result Value   Leukocytes,Ua LARGE (*)    WBC, UA >50 (*)    All other components within normal limits  GC/CHLAMYDIA PROBE AMP ([place]) NOT AT [facility]    EKG None  Radiology No results found.  Procedures Procedures    Medications Ordered in ED Medications  cefTRIAXone (ROCEPHIN) injection 500 mg (500 mg Intramuscular Given 09/14/21 2119)  doxycycline (VIBRA-TABS) tablet 100 mg (100 mg Oral Given 09/14/21 2119)    ED Course/ Medical Decision Making/ A&P Clinical Course as of 09/14/21 2258  Thu Sep 14, 2021  2257 Urinalysis, Routine w reflex microscopic Urine, Clean Catch(!) Large leukocyturia consistent with his complaint of penile discharge.  GC chlamydia pending. [HS]    Clinical Course User Index [HS] 2258, PA-C                           Medical  Decision Making Amount and/or Complexity of Data Reviewed Labs: ordered.  Risk Prescription drug management.   Patient presents with penile discharge.  Patient does not have any discharge on exam, there is no urethritis or scrotal pain.  Nothing this is PID, he is not febrile or tachycardic.  We will treat empirically for STDs, doxycycline sent to pharmacy.  UA and GC chlamydia sample required.  Patient discharged in stable condition.        Final Clinical Impression(s) / ED Diagnoses Final diagnoses:  Penile discharge    Rx / DC Orders ED Discharge Orders          Ordered    doxycycline (VIBRAMYCIN) 100 MG capsule  2 times daily        09/14/21 2109              Theron Arista, PA-C 09/14/21 2258    Rolan Bucco, MD 09/14/21  2321

## 2021-09-14 NOTE — ED Triage Notes (Signed)
Patient c/o penile discharge started yesterday. Pt had sexual intercourse this weekend. Pt denies N/V and fever.

## 2021-09-14 NOTE — Discharge Instructions (Addendum)
Take doxycycline twice daily for the next 10 days.  Follow-up with health department for repeat testing and make sure you are STD free.  Should any fevers, abdominal pain, new symptoms return to ED for further evaluation.  Please practice safe sex and use condoms.
